# Patient Record
Sex: Female | Born: 1975 | Race: Black or African American | Hispanic: No | Marital: Married | Smoking: Never smoker
Health system: Southern US, Community
[De-identification: ages and names within clinical notes are randomized; demographics above are authoritative.]

## PROBLEM LIST (undated history)

## (undated) ENCOUNTER — Inpatient Hospital Stay (HOSPITAL_COMMUNITY): Payer: Self-pay

## (undated) DIAGNOSIS — R569 Unspecified convulsions: Secondary | ICD-10-CM

## (undated) DIAGNOSIS — B009 Herpesviral infection, unspecified: Secondary | ICD-10-CM

## (undated) DIAGNOSIS — R5600 Simple febrile convulsions: Secondary | ICD-10-CM

## (undated) DIAGNOSIS — D219 Benign neoplasm of connective and other soft tissue, unspecified: Secondary | ICD-10-CM

## (undated) DIAGNOSIS — M5020 Other cervical disc displacement, unspecified cervical region: Secondary | ICD-10-CM

## (undated) DIAGNOSIS — Z8619 Personal history of other infectious and parasitic diseases: Secondary | ICD-10-CM

## (undated) HISTORY — DX: Personal history of other infectious and parasitic diseases: Z86.19

## (undated) HISTORY — DX: Herpesviral infection, unspecified: B00.9

## (undated) HISTORY — DX: Unspecified convulsions: R56.9

---

## 2011-01-08 LAB — OB RESULTS CONSOLE ABO/RH: RH Type: POSITIVE

## 2011-01-08 LAB — OB RESULTS CONSOLE HEPATITIS B SURFACE ANTIGEN: Hepatitis B Surface Ag: NEGATIVE

## 2011-01-08 LAB — OB RESULTS CONSOLE RUBELLA ANTIBODY, IGM: Rubella: IMMUNE

## 2011-01-08 LAB — OB RESULTS CONSOLE GC/CHLAMYDIA
Chlamydia: NEGATIVE
Gonorrhea: NEGATIVE

## 2011-01-08 LAB — OB RESULTS CONSOLE RPR: RPR: NONREACTIVE

## 2011-01-08 LAB — OB RESULTS CONSOLE ANTIBODY SCREEN: Antibody Screen: NEGATIVE

## 2011-01-08 LAB — OB RESULTS CONSOLE HIV ANTIBODY (ROUTINE TESTING): HIV: NONREACTIVE

## 2011-04-06 ENCOUNTER — Encounter (HOSPITAL_BASED_OUTPATIENT_CLINIC_OR_DEPARTMENT_OTHER): Payer: Self-pay | Admitting: Emergency Medicine

## 2011-04-06 ENCOUNTER — Emergency Department (HOSPITAL_BASED_OUTPATIENT_CLINIC_OR_DEPARTMENT_OTHER)
Admission: EM | Admit: 2011-04-06 | Discharge: 2011-04-06 | Disposition: A | Discharge status: Home / Self Care | Payer: BC Managed Care – PPO | Attending: Emergency Medicine | Admitting: Emergency Medicine

## 2011-04-06 DIAGNOSIS — R109 Unspecified abdominal pain: Secondary | ICD-10-CM | POA: Insufficient documentation

## 2011-04-06 DIAGNOSIS — N949 Unspecified condition associated with female genital organs and menstrual cycle: Secondary | ICD-10-CM

## 2011-04-06 LAB — DIFFERENTIAL
Basophils Absolute: 0 10*3/uL (ref 0.0–0.1)
Basophils Relative: 0 % (ref 0–1)
Eosinophils Absolute: 0.1 10*3/uL (ref 0.0–0.7)
Eosinophils Relative: 1 % (ref 0–5)
Lymphocytes Relative: 13 % (ref 12–46)
Lymphs Abs: 1 10*3/uL (ref 0.7–4.0)
Monocytes Absolute: 0.5 10*3/uL (ref 0.1–1.0)
Monocytes Relative: 7 % (ref 3–12)
Neutro Abs: 5.6 10*3/uL (ref 1.7–7.7)
Neutrophils Relative %: 79 % — ABNORMAL HIGH (ref 43–77)

## 2011-04-06 LAB — URINALYSIS, ROUTINE W REFLEX MICROSCOPIC
Bilirubin Urine: NEGATIVE
Glucose, UA: NEGATIVE mg/dL
Hgb urine dipstick: NEGATIVE
Ketones, ur: NEGATIVE mg/dL
Leukocytes, UA: NEGATIVE
Nitrite: NEGATIVE
Protein, ur: NEGATIVE mg/dL
Specific Gravity, Urine: 1.012 (ref 1.005–1.030)
Urobilinogen, UA: 0.2 mg/dL (ref 0.0–1.0)
pH: 8 (ref 5.0–8.0)

## 2011-04-06 LAB — CBC
HCT: 29.8 % — ABNORMAL LOW (ref 36.0–46.0)
Hemoglobin: 10.3 g/dL — ABNORMAL LOW (ref 12.0–15.0)
MCH: 30.8 pg (ref 26.0–34.0)
MCHC: 34.6 g/dL (ref 30.0–36.0)
MCV: 89.2 fL (ref 78.0–100.0)
Platelets: 149 10*3/uL — ABNORMAL LOW (ref 150–400)
RBC: 3.34 MIL/uL — ABNORMAL LOW (ref 3.87–5.11)
RDW: 12.2 % (ref 11.5–15.5)
WBC: 7.1 10*3/uL (ref 4.0–10.5)

## 2011-04-06 NOTE — ED Notes (Signed)
Pt c/o intermittent LLQ pain x 1 wk which worsens after straining to have BM.  Spoke to [facility] by phone last week.

## 2011-04-06 NOTE — ED Provider Notes (Signed)
History     CSN: [id number]  Arrival date & time 04/06/11  0902   First MD Initiated Contact with Patient 04/06/11 1114      Chief Complaint  Patient presents with  . Abdominal Pain    (Consider location/radiation/quality/duration/timing/severity/associated sxs/prior treatment) Patient is a 36 y.o. female presenting with abdominal pain. The history is provided by the patient.  Abdominal Pain The primary symptoms of the illness include abdominal pain.  She is pregnant at [redacted] weeks gestation and is primigravida. For the last week, she has been having intermittent pain in the left suprapubic area. She has been told that she has a small fibroid in that area and was told that it would not affect the pregnancy. Pain is moderate and sometimes worse if she stands for too long. She denies urinary urgency frequency tenesmus or dysuria. Denies vaginal discharge or bleeding. She denies nausea, vomiting or diarrhea. She sometimes feels like she has to strain at a bowel movement but has also been having normal bowel movements. Current pain is rated at 2/10, but it has been as severe as 6/10. There've been no difficulties with this pregnancy. The symptoms have neither been getting better nor worse since onset 1 week ago.  History reviewed. No pertinent past medical history.  History reviewed. No pertinent past surgical history.  No family history on file.  History  Substance Use Topics  . Smoking status: Never Smoker   . Smokeless tobacco: Not on file  . Alcohol Use: No    OB History    Grav Para Term Preterm Abortions TAB SAB Ect Mult Living   1               Review of Systems  Gastrointestinal: Positive for abdominal pain.  All other systems reviewed and are negative.    Allergies  Review of patient's allergies indicates no known allergies.  Home Medications   Current Outpatient Rx  Name Route Sig Dispense Refill  . PRENATAL PO Oral Take 1 tablet by mouth daily.      BP  121/68  Pulse 103  Temp(Src) 98.2 F (36.8 C) (Oral)  Resp 16  SpO2 100%  Physical Exam  Nursing note and vitals reviewed.  36 year old female is resting comfortably and in no acute distress. Vital signs are normal. Head is normocephalic and atraumatic. PERRLA, EOMI. Oropharynx is clear. Neck is nontender and supple. Back is nontender. There's no CVA tenderness. Lungs are clear without rales, wheezes, or rhonchi. Heart has regular rate rhythm without murmur. Abdomen is soft with mild tenderness in the left suprapubic area. Pain is elicited with stress applied to the uterus distracting it to the right consistent with round ligament pain. There is no rebound or guarding. Fundal height is consistent with 18 week pregnancy. There is no other abdominal tenderness. Peristalsis is present. Extremities have no cyanosis or edema. Neurologic: Mental status is normal, cranial nerves are intact, there no focal motor or sensory deficits.  ED Course  Procedures (including critical care time)  Results for orders placed during the hospital encounter of 04/06/11  URINALYSIS, ROUTINE W REFLEX MICROSCOPIC      Component Value Range   Color, Urine YELLOW  YELLOW    APPearance CLEAR  CLEAR    Specific Gravity, Urine 1.012  1.005 - 1.030    pH 8.0  5.0 - 8.0    Glucose, UA NEGATIVE  NEGATIVE (mg/dL)   Hgb urine dipstick NEGATIVE  NEGATIVE    Bilirubin Urine NEGATIVE  NEGATIVE    Ketones, ur NEGATIVE  NEGATIVE (mg/dL)   Protein, ur NEGATIVE  NEGATIVE (mg/dL)   Urobilinogen, UA 0.2  0.0 - 1.0 (mg/dL)   Nitrite NEGATIVE  NEGATIVE    Leukocytes, UA NEGATIVE  NEGATIVE   CBC      Component Value Range   WBC 7.1  4.0 - 10.5 (K/uL)   RBC 3.34 (*) 3.87 - 5.11 (MIL/uL)   Hemoglobin 10.3 (*) 12.0 - 15.0 (g/dL)   HCT 16.1 (*) 09.6 - 46.0 (%)   MCV 89.2  78.0 - 100.0 (fL)   MCH 30.8  26.0 - 34.0 (pg)   MCHC 34.6  30.0 - 36.0 (g/dL)   RDW 04.5  40.9 - 81.1 (%)   Platelets 149 (*) 150 - 400 (K/uL)  DIFFERENTIAL        Component Value Range   Neutrophils Relative 79 (*) 43 - 77 (%)   Neutro Abs 5.6  1.7 - 7.7 (K/uL)   Lymphocytes Relative 13  12 - 46 (%)   Lymphs Abs 1.0  0.7 - 4.0 (K/uL)   Monocytes Relative 7  3 - 12 (%)   Monocytes Absolute 0.5  0.1 - 1.0 (K/uL)   Eosinophils Relative 1  0 - 5 (%)   Eosinophils Absolute 0.1  0.0 - 0.7 (K/uL)   Basophils Relative 0  0 - 1 (%)   Basophils Absolute 0.0  0.0 - 0.1 (K/uL)   No results found.  Workup is negative for any serious pathology. She'll be treated symptomatically and followup with her obstetrician.  1. Round ligament pain       MDM  Probable round ligament pain. Urinalysis and CBC will be obtained to evaluate for other pathology.        Dione Booze, MD 04/06/11 1230

## 2011-07-29 LAB — OB RESULTS CONSOLE GBS: GBS: NEGATIVE

## 2011-09-02 ENCOUNTER — Inpatient Hospital Stay (HOSPITAL_COMMUNITY): Admission: AD | Admit: 2011-09-02 | Payer: Self-pay | Source: Ambulatory Visit | Admitting: Obstetrics and Gynecology

## 2011-09-03 ENCOUNTER — Encounter (HOSPITAL_COMMUNITY): Payer: Self-pay | Admitting: *Deleted

## 2011-09-03 ENCOUNTER — Telehealth (HOSPITAL_COMMUNITY): Payer: Self-pay | Admitting: *Deleted

## 2011-09-03 NOTE — Telephone Encounter (Signed)
Preadmission screen  

## 2011-09-09 ENCOUNTER — Encounter (HOSPITAL_COMMUNITY): Payer: Self-pay

## 2011-09-09 ENCOUNTER — Inpatient Hospital Stay (HOSPITAL_COMMUNITY)
Admission: RE | Admit: 2011-09-09 | Discharge: 2011-09-12 | DRG: 370 | Disposition: A | Discharge status: Home / Self Care | Payer: BC Managed Care – PPO | Source: Ambulatory Visit | Attending: Obstetrics & Gynecology | Admitting: Obstetrics & Gynecology

## 2011-09-09 DIAGNOSIS — O480 Post-term pregnancy: Principal | ICD-10-CM | POA: Diagnosis present

## 2011-09-09 DIAGNOSIS — D696 Thrombocytopenia, unspecified: Secondary | ICD-10-CM | POA: Diagnosis not present

## 2011-09-09 DIAGNOSIS — D4959 Neoplasm of unspecified behavior of other genitourinary organ: Secondary | ICD-10-CM | POA: Diagnosis present

## 2011-09-09 DIAGNOSIS — D689 Coagulation defect, unspecified: Secondary | ICD-10-CM | POA: Diagnosis not present

## 2011-09-09 DIAGNOSIS — D259 Leiomyoma of uterus, unspecified: Secondary | ICD-10-CM | POA: Diagnosis present

## 2011-09-09 DIAGNOSIS — O9902 Anemia complicating childbirth: Secondary | ICD-10-CM | POA: Diagnosis present

## 2011-09-09 DIAGNOSIS — O34599 Maternal care for other abnormalities of gravid uterus, unspecified trimester: Secondary | ICD-10-CM | POA: Diagnosis present

## 2011-09-09 DIAGNOSIS — D638 Anemia in other chronic diseases classified elsewhere: Secondary | ICD-10-CM | POA: Diagnosis present

## 2011-09-09 HISTORY — DX: Benign neoplasm of connective and other soft tissue, unspecified: D21.9

## 2011-09-09 LAB — CBC
HCT: 33.5 % — ABNORMAL LOW (ref 36.0–46.0)
Hemoglobin: 11.4 g/dL — ABNORMAL LOW (ref 12.0–15.0)
MCH: 31.4 pg (ref 26.0–34.0)
MCHC: 34 g/dL (ref 30.0–36.0)
MCV: 92.3 fL (ref 78.0–100.0)
Platelets: 158 10*3/uL (ref 150–400)
RBC: 3.63 MIL/uL — ABNORMAL LOW (ref 3.87–5.11)
RDW: 14.2 % (ref 11.5–15.5)
WBC: 7.9 10*3/uL (ref 4.0–10.5)

## 2011-09-09 MED ORDER — LIDOCAINE HCL (PF) 1 % IJ SOLN: 30.0000 mL | INTRAMUSCULAR | Status: DC | PRN

## 2011-09-09 MED ORDER — TERBUTALINE SULFATE 1 MG/ML IJ SOLN: 0.2500 mg | Freq: Once | INTRAMUSCULAR | Status: AC | PRN

## 2011-09-09 MED ORDER — CITRIC ACID-SODIUM CITRATE 334-500 MG/5ML PO SOLN
30.0000 mL | ORAL | Status: DC | PRN
  Administered 2011-09-10: 30 mL via ORAL
  Filled 2011-09-09: qty 15

## 2011-09-09 MED ORDER — ZOLPIDEM TARTRATE 5 MG PO TABS: 5.0000 mg | ORAL_TABLET | Freq: Every evening | ORAL | Status: DC | PRN

## 2011-09-09 MED ORDER — IBUPROFEN 600 MG PO TABS: 600.0000 mg | ORAL_TABLET | Freq: Four times a day (QID) | ORAL | Status: DC | PRN

## 2011-09-09 MED ORDER — LACTATED RINGERS IV SOLN
INTRAVENOUS | Status: DC
  Administered 2011-09-09 – 2011-09-10 (×5): via INTRAVENOUS

## 2011-09-09 MED ORDER — MISOPROSTOL 25 MCG QUARTER TABLET
25.0000 ug | ORAL_TABLET | ORAL | Status: DC | PRN
  Administered 2011-09-09: 25 ug via VAGINAL
  Filled 2011-09-09: qty 0.25

## 2011-09-09 MED ORDER — ACETAMINOPHEN 325 MG PO TABS: 650.0000 mg | ORAL_TABLET | ORAL | Status: DC | PRN

## 2011-09-09 MED ORDER — BUTORPHANOL TARTRATE 2 MG/ML IJ SOLN: 1.0000 mg | INTRAMUSCULAR | Status: DC | PRN

## 2011-09-09 MED ORDER — OXYCODONE-ACETAMINOPHEN 5-325 MG PO TABS: 1.0000 | ORAL_TABLET | ORAL | Status: DC | PRN

## 2011-09-09 MED ORDER — ONDANSETRON HCL 4 MG/2ML IJ SOLN: 4.0000 mg | Freq: Four times a day (QID) | INTRAMUSCULAR | Status: DC | PRN

## 2011-09-09 MED ORDER — FLEET ENEMA 7-19 GM/118ML RE ENEM: 1.0000 | ENEMA | RECTAL | Status: DC | PRN

## 2011-09-09 MED ORDER — OXYTOCIN BOLUS FROM INFUSION
250.0000 mL | Freq: Once | INTRAVENOUS | Status: DC
  Filled 2011-09-09: qty 500

## 2011-09-09 MED ORDER — LACTATED RINGERS IV SOLN
500.0000 mL | INTRAVENOUS | Status: DC | PRN
  Administered 2011-09-10 (×2): 500 mL via INTRAVENOUS
  Administered 2011-09-10: 1000 mL via INTRAVENOUS

## 2011-09-09 MED ORDER — OXYTOCIN 40 UNITS IN LACTATED RINGERS INFUSION - SIMPLE MED: 62.5000 mL/h | Freq: Once | INTRAVENOUS | Status: DC

## 2011-09-09 NOTE — Progress Notes (Signed)
Dr Christell Constant at bedside without RN explaining to patient plan of caref. He noted prolonged decel in FHR. Dr turned to right side. RN informed to not give second cytotec dose until speaking with Dr Christell Constant to assure good FHR.

## 2011-09-09 NOTE — Progress Notes (Signed)
Melinda [name] is a 36 y.o. G1P0 at [redacted]w[redacted]d by LMP admitted for induction of labor due to Post dates. Due date 09-02-11.  Subjective:  No complaints   Objective: BP 114/75  Pulse 83  Temp 98.3 F (36.8 C) (Oral)  Resp 18  Ht 5\' 6"  (1.676 m)  Wt 92.987 kg (205 lb)  BMI 33.09 kg/m2  LMP 11/26/2010      FHT:  FHR: 140-150s bpm, variability: moderate,  accelerations:  Present,  decelerations:  Present 3 minute prolonged decelfollowed by 2 minutes and has returned to baseline after being placed in RLD UC:   irregular, every 2-3 minutes SVE:   Dilation: 1 Effacement (%): 50 Station: -2 Exam by:: Larose Kells RN  Labs: Lab Results  Component Value Date   WBC 7.9 09/09/2011   HGB 11.4* 09/09/2011   HCT 33.5* 09/09/2011   MCV 92.3 09/09/2011   PLT 158 09/09/2011    Assessment / Plan: Induction of labor due to postterm,  progressing well on pitocin  Labor: for cervical rkipening and concerned about fetal reserve to tolerate labor and is CAT II but now reassuring with change of position Fetal Wellbeing:  Category II Pain Control:  none for now Anticipated MOD:  will depend on FHR  2 Kolyn Rozario H. 09/09/2011, 11:23 PM

## 2011-09-09 NOTE — H&P (Addendum)
History and Physical  CC: Cervical ripening  HPI:  Melinda [name] is an 36 y.o. female. Is here for induction of labor secondary to post dates and with an unfavorable Bishop score will proceed with cervical ripening   ROS: negative and all systems reviewed  Review of Systems - General ROS: negative Psychological ROS: negative Ophthalmic ROS: negative ENT ROS: negative Allergy and Immunology ROS: negative Hematological and Lymphatic ROS: negative Endocrine ROS: negative Respiratory ROS: no cough, shortness of breath, or wheezing Cardiovascular ROS: no chest pain or dyspnea on exertion Gastrointestinal ROS: no abdominal pain, change in bowel habits, or black or bloody stools Genito-Urinary ROS: no dysuria, trouble voiding, or hematuria Musculoskeletal ROS: negative Neurological ROS: negative Dermatological ROS: negative OB History: G1, P0  Pertinent Gynecological History: Contraception: none DES exposure: denies Blood transfusions: none Sexually transmitted diseases: 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   Menses: flow is moderate Bleeding: na  PMH:   Past Medical History  Diagnosis Date  . 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 Herpes   . H/O varicella   . Fibroid     PSH:   Past Surgical History  Procedure Date  . No past surgeries     Medications:   Prescriptions prior to admission  Medication Sig Dispense Refill  . Prenatal Vit-Fe Fumarate-FA (PRENATAL PO) Take 1 tablet by mouth daily.      . valACYclovir (VALTREX) 500 MG tablet Take 500 mg by mouth daily.        Allergies:   Allergies  Allergen Reactions  . Latex Rash    Family Hx:   Family History  Problem Relation Age of Onset  . Goiter Maternal Grandmother   . Hypertension Maternal Grandmother   . Diabetes  Maternal Grandfather   . Cancer Maternal Grandfather     Social History:   reports that she has never smoked. She has never used smokeless tobacco. She reports that she does not drink alcohol or use illicit drugs.  Blood pressure 114/75, pulse 83, temperature 98.3 F (36.8 C), temperature source Oral, resp. rate 18, height 5\' 6"  (1.676 m), weight 92.987 kg (205 lb), last menstrual period 11/26/2010. @PHYSEXAMBYAGE2 @ Gen well appearing gravid HEENT : WNL CHEST cta CV RRR  ABD gravid nontender EXT: 3+ edema DTRs 1-2 + Exam per RN but last in office revealed a gynecoid pelvis and EFW 8#8  Results for orders placed during the hospital encounter of 09/09/11 (from the past 24 hour(s))  CBC     Status: Abnormal   Collection Time   09/09/11  8:10 PM      Component Value Range   WBC 7.9  4.0 - 10.5 K/uL   RBC 3.63 (*) 3.87 - 5.11 MIL/uL   Hemoglobin 11.4 (*) 12.0 - 15.0 g/dL   HCT 40.9 (*) 81.1 - 91.4 %   MCV 92.3  78.0 - 100.0 fL   MCH 31.4  26.0 - 34.0 pg   MCHC 34.0  30.0 - 36.0 g/dL   RDW 78.2  95.6 - 21.3 %   Platelets 158  150 - 400 K/uL    No results found.  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 09/09/2011, 11:05  PM  Addendum    The FHR is revieweed and the FHR has 3 minute  decel that has  RLD now and will monitor at the bedside to ensure return to baseline

## 2011-09-10 ENCOUNTER — Encounter (HOSPITAL_COMMUNITY)
Admission: RE | Disposition: A | Discharge status: Home / Self Care | Payer: Self-pay | Source: Ambulatory Visit | Attending: Obstetrics & Gynecology

## 2011-09-10 ENCOUNTER — Encounter (HOSPITAL_COMMUNITY): Payer: Self-pay

## 2011-09-10 ENCOUNTER — Inpatient Hospital Stay (HOSPITAL_COMMUNITY): Payer: BC Managed Care – PPO | Admitting: Anesthesiology

## 2011-09-10 ENCOUNTER — Encounter (HOSPITAL_COMMUNITY): Payer: Self-pay | Admitting: Anesthesiology

## 2011-09-10 LAB — TYPE AND SCREEN
ABO/RH(D): A POS
Antibody Screen: NEGATIVE

## 2011-09-10 LAB — ABO/RH: ABO/RH(D): A POS

## 2011-09-10 LAB — RPR: RPR Ser Ql: NONREACTIVE

## 2011-09-10 SURGERY — Surgical Case
Anesthesia: Epidural | Site: Abdomen | Wound class: Clean Contaminated

## 2011-09-10 MED ORDER — KETOROLAC TROMETHAMINE 60 MG/2ML IM SOLN
INTRAMUSCULAR | Status: AC
  Filled 2011-09-10: qty 2

## 2011-09-10 MED ORDER — CEFAZOLIN SODIUM 1-5 GM-% IV SOLN
INTRAVENOUS | Status: DC | PRN
  Administered 2011-09-10: 2 g via INTRAVENOUS

## 2011-09-10 MED ORDER — PHENYLEPHRINE 40 MCG/ML (10ML) SYRINGE FOR IV PUSH (FOR BLOOD PRESSURE SUPPORT)
80.0000 ug | PREFILLED_SYRINGE | INTRAVENOUS | Status: DC | PRN
  Filled 2011-09-10: qty 5

## 2011-09-10 MED ORDER — FENTANYL 2.5 MCG/ML BUPIVACAINE 1/10 % EPIDURAL INFUSION (WH - ANES)
INTRAMUSCULAR | Status: DC | PRN
  Administered 2011-09-10: 14 mL/h via EPIDURAL

## 2011-09-10 MED ORDER — ONDANSETRON HCL 4 MG/2ML IJ SOLN
INTRAMUSCULAR | Status: DC | PRN
  Administered 2011-09-10: 4 mg via INTRAVENOUS

## 2011-09-10 MED ORDER — CEFAZOLIN SODIUM-DEXTROSE 2-3 GM-% IV SOLR
INTRAVENOUS | Status: AC
  Filled 2011-09-10: qty 50

## 2011-09-10 MED ORDER — FENTANYL CITRATE 0.05 MG/ML IJ SOLN
INTRAMUSCULAR | Status: DC | PRN
  Administered 2011-09-10 (×2): 50 ug via INTRAVENOUS

## 2011-09-10 MED ORDER — OXYTOCIN 40 UNITS IN LACTATED RINGERS INFUSION - SIMPLE MED
1.0000 m[IU]/min | INTRAVENOUS | Status: DC
  Administered 2011-09-10: 1 m[IU]/min via INTRAVENOUS
  Filled 2011-09-10: qty 1000

## 2011-09-10 MED ORDER — SODIUM BICARBONATE 8.4 % IV SOLN
INTRAVENOUS | Status: DC | PRN
  Administered 2011-09-10: 4 mL via EPIDURAL

## 2011-09-10 MED ORDER — KETOROLAC TROMETHAMINE 60 MG/2ML IM SOLN
60.0000 mg | Freq: Once | INTRAMUSCULAR | Status: AC | PRN
  Administered 2011-09-10: 60 mg via INTRAMUSCULAR

## 2011-09-10 MED ORDER — OXYTOCIN 10 UNIT/ML IJ SOLN
40.0000 [IU] | INTRAVENOUS | Status: DC | PRN
  Administered 2011-09-10: 40 [IU] via INTRAVENOUS

## 2011-09-10 MED ORDER — MORPHINE SULFATE 0.5 MG/ML IJ SOLN
INTRAMUSCULAR | Status: AC
  Filled 2011-09-10: qty 10

## 2011-09-10 MED ORDER — MORPHINE SULFATE (PF) 0.5 MG/ML IJ SOLN
INTRAMUSCULAR | Status: DC | PRN
  Administered 2011-09-10: 4 mg via EPIDURAL

## 2011-09-10 MED ORDER — LACTATED RINGERS IV SOLN
INTRAVENOUS | Status: DC | PRN
  Administered 2011-09-10: 22:00:00 via INTRAVENOUS

## 2011-09-10 MED ORDER — LACTATED RINGERS IV SOLN
INTRAVENOUS | Status: DC
  Administered 2011-09-10: 08:00:00 via INTRAUTERINE

## 2011-09-10 MED ORDER — MEPERIDINE HCL 25 MG/ML IJ SOLN
6.2500 mg | INTRAMUSCULAR | Status: DC | PRN
  Administered 2011-09-10: 6.25 mg via INTRAVENOUS

## 2011-09-10 MED ORDER — EPHEDRINE 5 MG/ML INJ
10.0000 mg | INTRAVENOUS | Status: DC | PRN
  Filled 2011-09-10: qty 4

## 2011-09-10 MED ORDER — MEPERIDINE HCL 25 MG/ML IJ SOLN: 6.2500 mg | INTRAMUSCULAR | Status: DC | PRN

## 2011-09-10 MED ORDER — PHENYLEPHRINE HCL 10 MG/ML IJ SOLN
INTRAMUSCULAR | Status: DC | PRN
  Administered 2011-09-10: 120 ug via INTRAVENOUS
  Administered 2011-09-10 (×2): 80 ug via INTRAVENOUS

## 2011-09-10 MED ORDER — HYDROMORPHONE HCL PF 1 MG/ML IJ SOLN: 0.2500 mg | INTRAMUSCULAR | Status: DC | PRN

## 2011-09-10 MED ORDER — EPHEDRINE 5 MG/ML INJ
10.0000 mg | INTRAVENOUS | Status: DC | PRN
  Administered 2011-09-10: 5 mg via INTRAVENOUS

## 2011-09-10 MED ORDER — LIDOCAINE-EPINEPHRINE (PF) 2 %-1:200000 IJ SOLN
INTRAMUSCULAR | Status: AC
  Filled 2011-09-10: qty 20

## 2011-09-10 MED ORDER — MEPERIDINE HCL 25 MG/ML IJ SOLN
INTRAMUSCULAR | Status: AC
  Filled 2011-09-10: qty 1

## 2011-09-10 MED ORDER — LACTATED RINGERS IV SOLN
INTRAVENOUS | Status: DC
  Administered 2011-09-11: 08:00:00 via INTRAVENOUS
  Administered 2011-09-11: 1000 mL via INTRAVENOUS

## 2011-09-10 MED ORDER — DIPHENHYDRAMINE HCL 50 MG/ML IJ SOLN: 12.5000 mg | INTRAMUSCULAR | Status: DC | PRN

## 2011-09-10 MED ORDER — TERBUTALINE SULFATE 1 MG/ML IJ SOLN
INTRAMUSCULAR | Status: AC
  Filled 2011-09-10: qty 1

## 2011-09-10 MED ORDER — MORPHINE SULFATE (PF) 0.5 MG/ML IJ SOLN
INTRAMUSCULAR | Status: DC | PRN
  Administered 2011-09-10: 1 mg via INTRAVENOUS

## 2011-09-10 MED ORDER — LACTATED RINGERS IV SOLN
500.0000 mL | Freq: Once | INTRAVENOUS | Status: AC
  Administered 2011-09-10: 1000 mL via INTRAVENOUS

## 2011-09-10 MED ORDER — SCOPOLAMINE 1 MG/3DAYS TD PT72
MEDICATED_PATCH | TRANSDERMAL | Status: AC
  Filled 2011-09-10: qty 1

## 2011-09-10 MED ORDER — PHENYLEPHRINE 40 MCG/ML (10ML) SYRINGE FOR IV PUSH (FOR BLOOD PRESSURE SUPPORT): 80.0000 ug | PREFILLED_SYRINGE | INTRAVENOUS | Status: DC | PRN

## 2011-09-10 MED ORDER — FENTANYL CITRATE 0.05 MG/ML IJ SOLN
INTRAMUSCULAR | Status: AC
  Filled 2011-09-10: qty 2

## 2011-09-10 MED ORDER — SODIUM BICARBONATE 8.4 % IV SOLN
INTRAVENOUS | Status: AC
  Filled 2011-09-10: qty 50

## 2011-09-10 MED ORDER — TERBUTALINE SULFATE 1 MG/ML IJ SOLN: 0.2500 mg | Freq: Once | INTRAMUSCULAR | Status: DC | PRN

## 2011-09-10 MED ORDER — OXYTOCIN 10 UNIT/ML IJ SOLN
INTRAMUSCULAR | Status: AC
  Filled 2011-09-10: qty 4

## 2011-09-10 MED ORDER — ONDANSETRON HCL 4 MG/2ML IJ SOLN
INTRAMUSCULAR | Status: AC
  Filled 2011-09-10: qty 2

## 2011-09-10 MED ORDER — PHENYLEPHRINE 40 MCG/ML (10ML) SYRINGE FOR IV PUSH (FOR BLOOD PRESSURE SUPPORT)
PREFILLED_SYRINGE | INTRAVENOUS | Status: AC
  Filled 2011-09-10: qty 5

## 2011-09-10 MED ORDER — SCOPOLAMINE 1 MG/3DAYS TD PT72
1.0000 | MEDICATED_PATCH | Freq: Once | TRANSDERMAL | Status: DC
  Administered 2011-09-10: 1.5 mg via TRANSDERMAL

## 2011-09-10 MED ORDER — PROMETHAZINE HCL 25 MG/ML IJ SOLN: 6.2500 mg | INTRAMUSCULAR | Status: DC | PRN

## 2011-09-10 MED ORDER — KETOROLAC TROMETHAMINE 30 MG/ML IJ SOLN
15.0000 mg | Freq: Once | INTRAMUSCULAR | Status: AC | PRN
  Filled 2011-09-10: qty 1

## 2011-09-10 MED ORDER — FENTANYL 2.5 MCG/ML BUPIVACAINE 1/10 % EPIDURAL INFUSION (WH - ANES)
14.0000 mL/h | INTRAMUSCULAR | Status: DC
  Administered 2011-09-10: 14 mL/h via EPIDURAL
  Filled 2011-09-10 (×2): qty 60

## 2011-09-10 SURGICAL SUPPLY — 31 items
BENZOIN TINCTURE PRP APPL 2/3 (GAUZE/BANDAGES/DRESSINGS) ×2 IMPLANT
CLOTH BEACON ORANGE TIMEOUT ST (SAFETY) ×2 IMPLANT
DRESSING TELFA 8X3 (GAUZE/BANDAGES/DRESSINGS) ×2 IMPLANT
DRSG COVADERM 4X10 (GAUZE/BANDAGES/DRESSINGS) ×2 IMPLANT
ELECT REM PT RETURN 9FT ADLT (ELECTROSURGICAL) ×2
ELECTRODE REM PT RTRN 9FT ADLT (ELECTROSURGICAL) ×1 IMPLANT
EXTRACTOR VACUUM M CUP 4 TUBE (SUCTIONS) IMPLANT
GLOVE BIO SURGEON STRL SZ 6.5 (GLOVE) IMPLANT
GLOVE BIO SURGEON STRL SZ7.5 (GLOVE) ×2 IMPLANT
GLOVE BIOGEL PI IND STRL 7.0 (GLOVE) IMPLANT
GLOVE BIOGEL PI IND STRL 8 (GLOVE) ×1 IMPLANT
GLOVE BIOGEL PI INDICATOR 7.0 (GLOVE)
GLOVE BIOGEL PI INDICATOR 8 (GLOVE) ×1
GOWN PREVENTION PLUS LG XLONG (DISPOSABLE) ×2 IMPLANT
GOWN PREVENTION PLUS XLARGE (GOWN DISPOSABLE) IMPLANT
GOWN STRL REIN XL XLG (GOWN DISPOSABLE) ×2 IMPLANT
KIT ABG SYR 3ML LUER SLIP (SYRINGE) ×4 IMPLANT
NEEDLE HYPO 25X5/8 SAFETYGLIDE (NEEDLE) ×4 IMPLANT
NS IRRIG 1000ML POUR BTL (IV SOLUTION) ×2 IMPLANT
PACK C SECTION WH (CUSTOM PROCEDURE TRAY) ×2 IMPLANT
PAD ABD 7.5X8 STRL (GAUZE/BANDAGES/DRESSINGS) ×2 IMPLANT
SLEEVE SCD COMPRESS KNEE MED (MISCELLANEOUS) ×2 IMPLANT
STRIP CLOSURE SKIN 1/2X4 (GAUZE/BANDAGES/DRESSINGS) ×2 IMPLANT
SUT GUT PLAIN 0 CT-3 TAN 27 (SUTURE) IMPLANT
SUT MON AB 4-0 PS1 27 (SUTURE) ×2 IMPLANT
SUT PLAIN 2 0 XLH (SUTURE) ×2 IMPLANT
SUT VIC AB 0 CT1 36 (SUTURE) ×10 IMPLANT
TAPE CLOTH SURG 4X10 WHT LF (GAUZE/BANDAGES/DRESSINGS) ×2 IMPLANT
TOWEL OR 17X24 6PK STRL BLUE (TOWEL DISPOSABLE) ×4 IMPLANT
TRAY FOLEY CATH 14FR (SET/KITS/TRAYS/PACK) IMPLANT
WATER STERILE IRR 1000ML POUR (IV SOLUTION) IMPLANT

## 2011-09-10 NOTE — Progress Notes (Signed)
Melinda [name] is a 36 y.o. G1P0 at [redacted]w[redacted]d by LMP admitted for induction of labor due to Post dates. Due date 09/09/11  Subjective: Starting to feel the contractions but not too bad Objective: BP 118/79  Pulse 93  Temp 98.4 F (36.9 C) (Oral)  Resp 20  Ht 5\' 6"  (1.676 m)  Wt 92.987 kg (205 lb)  BMI 33.09 kg/m2  LMP 11/26/2010      FHT:  FHR: 140s bpm, variability: moderate,  accelerations:  Present,  decelerations:  Present intermittent and sporadic decels and unable to fully qualify other than prolonged at times and unclear etiology especially in light of the very reassuring tracing at other times UC:   irregular, every 3-5 minutes SVE:   Dilation: 4 Effacement (%): 70 Station: -2 Exam by:: Enis Slipper, RN  Labs: Lab Results  Component Value Date   WBC 7.9 09/09/2011   HGB 11.4* 09/09/2011   HCT 33.5* 09/09/2011   MCV 92.3 09/09/2011   PLT 158 09/09/2011    Assessment / Plan: Induction of labor due to postterm,  progressing well on pitocin  Labor: Progressing on Pitocin, will continue to increase then AROM Preeclampsia:  na Fetal Wellbeing:  Category II Pain Control:  Epidural is planned I/D:  n/a Anticipated MOD:  NSVD  Mohanad Carsten H. 09/10/2011, 12:08 PM

## 2011-09-10 NOTE — Anesthesia Postprocedure Evaluation (Signed)
Anesthesia Post Note  Patient: Melinda [name]  Procedure(s) Performed: Procedure(s) (LRB): CESAREAN SECTION (N/A)  Anesthesia type: Epidural  Patient location: PACU  Post pain: Pain level controlled  Post assessment: Post-op Vital signs reviewed  Last Vitals:  Filed Vitals:   09/10/11 2307  BP:   Pulse: 103  Temp: 37.1 C  Resp:     Post vital signs: Reviewed  Level of consciousness: awake  Complications: No apparent anesthesia complications

## 2011-09-10 NOTE — Progress Notes (Signed)
Melinda [name] is a 36 y.o. G1P0000 at [redacted]w[redacted]d by LMP admitted for induction of labor due to Post dates. Due date 09-02-11.  Subjective:  Still comfotable Objective: BP 117/70  Pulse 97  Temp 99.1 F (37.3 C) (Axillary)  Resp 18  Ht 5\' 6"  (1.676 m)  Wt 92.987 kg (205 lb)  BMI 33.09 kg/m2  SpO2 100%  LMP 11/26/2010 I/O last 3 completed shifts: In: -  Out: 300 [Urine:300] Total I/O In: -  Out: 100 [Urine:100]  FHT:  FHR: 150s bpm, variability: moderate,  accelerations:  Present,  decelerations:  Present variables with some late appearing decels that are nonrepetitive as well and now with good reactivity UC:   Have decreased in intensity and frequency and now off of pitocin SVE:   Dilation: 5.5 Effacement (%): 90 Station: -2 Exam by:: Melinda Slipper, RN  Labs: Lab Results  Component Value Date   WBC 7.9 09/09/2011   HGB 11.4* 09/09/2011   HCT 33.5* 09/09/2011   MCV 92.3 09/09/2011   PLT 158 09/09/2011   2 Assessment / Plan: IUP 41w1 with category II tracing but resolves with pitocin off and seems to have more reassuring signs now  Labor:      Preeclampsia:    Fetal Wellbeing:  Category II Pain Control:  Epidural I/D:  n/a Anticipated MOD:     Melinda Forlenza H. 09/10/2011, 8:19 PM

## 2011-09-10 NOTE — Progress Notes (Signed)
Melinda [name] is a 36 y.o. G1P0000 at [redacted]w[redacted]d by LMP admitted for induction of labor due to Post dates. Due date 09-02-11.  Subjective:  Pt comfortable with CLE and progressing slowly   But there are intermittent episodic decelerations  Objective: BP 119/64  Pulse 227  Temp 97.9 F (36.6 C) (Axillary)  Resp 20  Ht 5\' 6"  (1.676 m)  Wt 92.987 kg (205 lb)  BMI 33.09 kg/m2  SpO2 100%  LMP 11/26/2010   Total I/O In: -  Out: 300 [Urine:300]  FHT:  FHR: 140s bpm, variability: moderate,  accelerations:  Present,  decelerations:  Present with decelerations and prolonged and may be related most recently to tachysystole and the FHR seems to return to baseline after severeal changes of position and may consider terb and will stay at bedside until resolved pit off and RLD and O2 on and did receive fluid bolus UC:a series of ctrx every minute and this has started to resolve with the pitocin off SVE:   Dilation: 4.5 Effacement (%): 90 Station: -2 Exam by:: Melinda [name], RN5-6/90/0 vertex with some minimal molding Labs: Lab Results  Component Value Date   WBC 7.9 09/09/2011   HGB 11.4* 09/09/2011   HCT 33.5* 09/09/2011   MCV 92.3 09/09/2011   PLT 158 09/09/2011    Assessment / Plan: Induction of labor due to postterm,  progressing well on pitocin I am concerned that the baby is developing intolerance of labor Labor: may have intolerance of labor and will see if she will tolerate with a rest right now and then possibly another trial in one hour Preeclampsia:  na Fetal Wellbeing:  Category II Pain Control:  Epidural I/D:  n/a Anticipated MOD:  unclear and amnioinfusion for meconium brown not particulate   Melinda [name] H. 09/10/2011, 6:13 PM

## 2011-09-10 NOTE — Progress Notes (Signed)
Melinda [name] is a 36 y.o. G1P0 at [redacted]w[redacted]d by LMP admitted for induction of labor due to Post dates. Due date 09-02-11.  Subjective:  States she slept well Objective: BP 105/52  Pulse 104  Temp 98 F (36.7 C) (Oral)  Resp 18  Ht 5\' 6"  (1.676 m)  Wt 92.987 kg (205 lb)  BMI 33.09 kg/m2  LMP 11/26/2010      FHT:  FHR: 130-140s bpm, variability: moderate,  accelerations:  Present,  decelerations:  Absent UC:   regular, every 3-4 minutes SVE:   Dilation: 1 Effacement (%): 50 Station: -2 Exam by:: Larose Kells RN 3-4/80/-3/vertex / soft /anterior and AROM with meconium and IUPC and amnioinfusion placed Labs: Lab Results  Component Value Date   WBC 7.9 09/09/2011   HGB 11.4* 09/09/2011   HCT 33.5* 09/09/2011   MCV 92.3 09/09/2011   PLT 158 09/09/2011    Assessment / Plan: Induction of labor due to postterm,  progressing well on pitocin  Labor: will start pitocin Preeclampsia:  na Fetal Wellbeing:  Category I Pain Control:  Epidural I/D:  n/a Anticipated MOD:  NSVD as baby has tolerated well the contractions overnight  Breken Nazari H. 09/10/2011, 7:15 AM

## 2011-09-10 NOTE — Transfer of Care (Signed)
Immediate Anesthesia Transfer of Care Note  Patient: Melinda [name]  Procedure(s) Performed: Procedure(s) (LRB): CESAREAN SECTION (N/A)  Patient Location: PACU  Anesthesia Type: Epidural  Level of Consciousness: awake, alert  and oriented  Airway & Oxygen Therapy: Patient Spontanous Breathing  Post-op Assessment: Report given to PACU RN and Post -op Vital signs reviewed and stable  Post vital signs: Reviewed and stable  Complications: No apparent anesthesia complications

## 2011-09-10 NOTE — Anesthesia Procedure Notes (Signed)
Epidural Patient location during procedure: OB  Preanesthetic Checklist Completed: patient identified, site marked, surgical consent, pre-op evaluation, timeout performed, IV checked, risks and benefits discussed and monitors and equipment checked  Epidural Patient position: sitting Prep: site prepped and draped and DuraPrep Patient monitoring: continuous pulse ox and blood pressure Approach: midline Injection technique: LOR air  Needle:  Needle type: Tuohy  Needle gauge: 17 G Needle length: 9 cm Needle insertion depth: 5 cm cm Catheter type: closed end flexible Catheter size: 19 Gauge Catheter at skin depth: 10 cm Test dose: negative  Assessment Events: blood not aspirated, injection not painful, no injection resistance, negative IV test and no paresthesia  Additional Notes Dosing of Epidural:  1st dose, through needle ............................................Marland Kitchen epi 1:200K + Xylocaine 40 mg  2nd dose, through catheter, after waiting 3 minutes...Marland KitchenMarland Kitchenepi 1:200K + Xylocaine 60 mg  3rd dose, through catheter after waiting 3 minutes .............................Marcaine   5mg    ( mg Marcaine are expressed as equivilent  cc's medication removed from the 0.1%Bupiv / fentanyl syringe from L&D pump)  ( 2% Xylo charted as a single dose in Epic Meds for ease of charting; actual dosing was fractionated as above, for saftey's sake)  As each dose occurred, patient was free of IV sx; and patient exhibited no evidence of SA injection.  Patient is more comfortable after epidural dosed. Please see RN's note for documentation of vital signs,and FHR which are stable.  Patient reminded not to try to ambulate with numb legs, and that an RN must be present when she attempts to get up.

## 2011-09-10 NOTE — Anesthesia Preprocedure Evaluation (Addendum)

## 2011-09-10 NOTE — Op Note (Signed)
Cesarean Section Procedure Note   09/10/2011   Melinda [name]      Pre-operative Diagnosis:   1. Fetal Intolerence to Labor.( Intermittent prolonged decelerations)    2.  nonreassuring fetal status (developed in the OR)    3.  IUP [redacted]w[redacted]d    4.  Fibroids    5.  Thick meconium      Post-operative Diagnosis: Same plus delivered   Surgeon: Delbert Harness.  Assistants: CST  Anesthesia: epidural    Findings:      1. Viable female infant with Apgars  APGAR (1 MIN):  9 APGAR (5 MINS):  9 APGAR (10 MINS):                   with a weight of @BABYWGTLBSEBC @ @BABYWGTOZEBC @ weight pending    2. umbilical cord artery pH 7.27    3. the placenta was calcified is a term placenta.    4. A foot cord was noted    5. Several fibroids were noted and the largest was left posterior fundus at 5 cm   Estimated Blood Loss:   Total IV Fluids:   Urine Output: 75CC OF bloody urine  Specimens: 1. Placenta to pathology  Complications: no complications  Disposition:  PACU - hemodynamically stable.  Maternal Condition: stable  Baby condition / location:  nursery-stable  Indications:fetal intolerance of labor and nonreassuring fetal status      The patient presented on 09/09/2011  to L&D and subsequently was induced with cytotec and Pitocin and progressed to 5 -6cm  0% effaced and 0 station but the FHR became nonreassuring .  amniotomy was performed this am and meconium brown thick nonparticulate fluid noted. .The patient then developed some reassuring category II tracing. Patient had previously been consented was once again told of the procedure. The anesthesiologist was notified and consent signed and the patient taken to the operating room.     The risks, benefits, complications, treatment options, and expected outcomes were discussed with the patient. The patient concurred with the proposed plan, giving informed consent  Procedure Details:  The patient was moved to the operating  room.  The patient was identified as  Melinda [name] and the procedure verified as C-Section Delivery. A Time Out was held and the above information confirmed. Ancef 2 g ongoing at that time the FHR was noted to declerate to 40 BPMs and the decision was made to move quickly toward delivery   The patient was draped and prepped in the usual sterile manner.  Anesthesia was noted to be adequate.   Pfannenstiel incsion was made and the subcutaneous tissue was incised to the fascia. The fascial was nicked over both rectus muscle bellies and then was extended transversely. Kocher clamps and the the fascia was separated from the underlying rectus muscle superiorly and inferiorly. The peritoneum was entered bluntly. The utero-vesical peritoneal reflection was elevated and incised transversely and the bladder flap was bluntly freed from the lower uterine segment. A low transverse uterine incision was made and maintained within the lower uterine segment. Delivered from cephalic presentation was a not available gram living newborn female infant with Apgar scores of 9 at one minute and 9 at five minutes. A cord ph was sent. The umbilical cord was clamped and cut cord. A sample was obtained for evaluation. The placenta was removed Intact and appeared normal except for a foot cord..    The uterine incision was closed with running locked sutures of  0 Vicryl. A second layer imbricated the first and one figure of eight was thrown on the midline of the hysterotomy to complete hemostasis. Hemostasis was observed.  The upper abdomen was irrigated.  Hysterotomy reinspected and noted to be hemostatic The uterus was returned to the abdomen and the pericolic sponges were removed.   Interceed was placed in a T fashion.  The fascia was then reapproximated with running 0 Vicryl and tied at the right 1/3 margin.  The skin was closed with 3-0 monocryl.. The wound was appropriately dressed patient placed on bed and taken to the recovery room  in awake and stable condition and the baby was in the father's arms heading to the birthing room.   Instrument sponge lap and needle  counts were correct prior the abdominal closure and were correct at the conclusion of the case.     Signed: Surgeon(s): Waverly Ferrari. Christell Constant, MD

## 2011-09-10 NOTE — Progress Notes (Signed)
Patient ID: Melinda [name], female   DOB: May 05, 1975, 37 y.o.   MRN: [id number] The FHR does not seem to be able to proceed with a time interval that would safely allow [place] to restart the pitocin and we have tried all of the conservative measures except for administering terbuataline.  We will proceed with CSx when the resources are available

## 2011-09-11 ENCOUNTER — Encounter (HOSPITAL_COMMUNITY): Payer: Self-pay

## 2011-09-11 LAB — COMPREHENSIVE METABOLIC PANEL
ALT: 13 U/L (ref 0–35)
AST: 25 U/L (ref 0–37)
Albumin: 2 g/dL — ABNORMAL LOW (ref 3.5–5.2)
Alkaline Phosphatase: 105 U/L (ref 39–117)
BUN: 10 mg/dL (ref 6–23)
CO2: 26 mEq/L (ref 19–32)
Calcium: 8.3 mg/dL — ABNORMAL LOW (ref 8.4–10.5)
Chloride: 100 mEq/L (ref 96–112)
Creatinine, Ser: 1.15 mg/dL — ABNORMAL HIGH (ref 0.50–1.10)
GFR calc Af Amer: 72 mL/min — ABNORMAL LOW (ref >90)
GFR calc non Af Amer: 62 mL/min — ABNORMAL LOW (ref >90)
Glucose, Bld: 80 mg/dL (ref 70–99)
Potassium: 4.5 mEq/L (ref 3.5–5.1)
Sodium: 132 mEq/L — ABNORMAL LOW (ref 135–145)
Total Bilirubin: 0.3 mg/dL (ref 0.3–1.2)
Total Protein: 5.1 g/dL — ABNORMAL LOW (ref 6.0–8.3)

## 2011-09-11 LAB — CBC
HCT: 31 % — ABNORMAL LOW (ref 36.0–46.0)
HCT: 28.9 % — ABNORMAL LOW (ref 36.0–46.0)
Hemoglobin: 10.3 g/dL — ABNORMAL LOW (ref 12.0–15.0)
Hemoglobin: 9.8 g/dL — ABNORMAL LOW (ref 12.0–15.0)
MCH: 31.1 pg (ref 26.0–34.0)
MCH: 31.6 pg (ref 26.0–34.0)
MCHC: 33.2 g/dL (ref 30.0–36.0)
MCHC: 33.9 g/dL (ref 30.0–36.0)
MCV: 93.7 fL (ref 78.0–100.0)
MCV: 93.2 fL (ref 78.0–100.0)
Platelets: 129 10*3/uL — ABNORMAL LOW (ref 150–400)
Platelets: 120 10*3/uL — ABNORMAL LOW (ref 150–400)
RBC: 3.31 MIL/uL — ABNORMAL LOW (ref 3.87–5.11)
RBC: 3.1 MIL/uL — ABNORMAL LOW (ref 3.87–5.11)
RDW: 14 % (ref 11.5–15.5)
RDW: 14.2 % (ref 11.5–15.5)
WBC: 17.8 10*3/uL — ABNORMAL HIGH (ref 4.0–10.5)
WBC: 15.9 10*3/uL — ABNORMAL HIGH (ref 4.0–10.5)

## 2011-09-11 LAB — CCBB MATERNAL DONOR DRAW

## 2011-09-11 MED ORDER — DIPHENHYDRAMINE HCL 50 MG/ML IJ SOLN: 25.0000 mg | INTRAMUSCULAR | Status: DC | PRN

## 2011-09-11 MED ORDER — SODIUM CHLORIDE 0.9 % IV SOLN
1.0000 ug/kg/h | INTRAVENOUS | Status: DC | PRN
  Filled 2011-09-11: qty 2.5

## 2011-09-11 MED ORDER — SIMETHICONE 80 MG PO CHEW
80.0000 mg | CHEWABLE_TABLET | Freq: Three times a day (TID) | ORAL | Status: DC
  Administered 2011-09-11 – 2011-09-12 (×5): 80 mg via ORAL

## 2011-09-11 MED ORDER — SENNOSIDES-DOCUSATE SODIUM 8.6-50 MG PO TABS
2.0000 | ORAL_TABLET | Freq: Every day | ORAL | Status: DC
  Administered 2011-09-11: 2 via ORAL

## 2011-09-11 MED ORDER — METOCLOPRAMIDE HCL 5 MG/ML IJ SOLN
10.0000 mg | Freq: Three times a day (TID) | INTRAMUSCULAR | Status: DC | PRN
  Administered 2011-09-11: 10 mg via INTRAVENOUS
  Filled 2011-09-11: qty 2

## 2011-09-11 MED ORDER — TETANUS-DIPHTH-ACELL PERTUSSIS 5-2.5-18.5 LF-MCG/0.5 IM SUSP: 0.5000 mL | Freq: Once | INTRAMUSCULAR | Status: DC

## 2011-09-11 MED ORDER — KETOROLAC TROMETHAMINE 30 MG/ML IJ SOLN
30.0000 mg | Freq: Four times a day (QID) | INTRAMUSCULAR | Status: AC | PRN
  Administered 2011-09-11: 30 mg via INTRAVENOUS

## 2011-09-11 MED ORDER — IBUPROFEN 600 MG PO TABS
600.0000 mg | ORAL_TABLET | Freq: Four times a day (QID) | ORAL | Status: DC
  Administered 2011-09-11 – 2011-09-12 (×4): 600 mg via ORAL
  Filled 2011-09-11 (×4): qty 1

## 2011-09-11 MED ORDER — WITCH HAZEL-GLYCERIN EX PADS: 1.0000 "application " | MEDICATED_PAD | CUTANEOUS | Status: DC | PRN

## 2011-09-11 MED ORDER — ONDANSETRON HCL 4 MG/2ML IJ SOLN
4.0000 mg | INTRAMUSCULAR | Status: DC | PRN
  Administered 2011-09-11: 4 mg via INTRAVENOUS
  Filled 2011-09-11: qty 2

## 2011-09-11 MED ORDER — PRENATAL MULTIVITAMIN CH
1.0000 | ORAL_TABLET | Freq: Every day | ORAL | Status: DC
  Administered 2011-09-12: 1 via ORAL
  Filled 2011-09-11: qty 1

## 2011-09-11 MED ORDER — KETOROLAC TROMETHAMINE 30 MG/ML IJ SOLN: 30.0000 mg | Freq: Four times a day (QID) | INTRAMUSCULAR | Status: AC | PRN

## 2011-09-11 MED ORDER — DIPHENHYDRAMINE HCL 25 MG PO CAPS: 25.0000 mg | ORAL_CAPSULE | Freq: Four times a day (QID) | ORAL | Status: DC | PRN

## 2011-09-11 MED ORDER — ONDANSETRON HCL 4 MG/2ML IJ SOLN: 4.0000 mg | Freq: Three times a day (TID) | INTRAMUSCULAR | Status: DC | PRN

## 2011-09-11 MED ORDER — OXYTOCIN 40 UNITS IN LACTATED RINGERS INFUSION - SIMPLE MED: 62.5000 mL/h | INTRAVENOUS | Status: AC

## 2011-09-11 MED ORDER — NALBUPHINE HCL 10 MG/ML IJ SOLN
5.0000 mg | INTRAMUSCULAR | Status: DC | PRN
  Filled 2011-09-11: qty 1

## 2011-09-11 MED ORDER — ONDANSETRON HCL 4 MG PO TABS: 4.0000 mg | ORAL_TABLET | ORAL | Status: DC | PRN

## 2011-09-11 MED ORDER — DIPHENHYDRAMINE HCL 25 MG PO CAPS: 25.0000 mg | ORAL_CAPSULE | ORAL | Status: DC | PRN

## 2011-09-11 MED ORDER — CEFAZOLIN SODIUM-DEXTROSE 2-3 GM-% IV SOLR
2.0000 g | INTRAVENOUS | Status: DC
  Filled 2011-09-11: qty 50

## 2011-09-11 MED ORDER — OXYCODONE-ACETAMINOPHEN 5-325 MG PO TABS: 1.0000 | ORAL_TABLET | ORAL | Status: DC | PRN

## 2011-09-11 MED ORDER — SODIUM CHLORIDE 0.9 % IJ SOLN: 3.0000 mL | INTRAMUSCULAR | Status: DC | PRN

## 2011-09-11 MED ORDER — ZOLPIDEM TARTRATE 5 MG PO TABS: 5.0000 mg | ORAL_TABLET | Freq: Every evening | ORAL | Status: DC | PRN

## 2011-09-11 MED ORDER — NALOXONE HCL 0.4 MG/ML IJ SOLN: 0.4000 mg | INTRAMUSCULAR | Status: DC | PRN

## 2011-09-11 MED ORDER — TETANUS-DIPHTH-ACELL PERTUSSIS 5-2.5-18.5 LF-MCG/0.5 IM SUSP
0.5000 mL | Freq: Once | INTRAMUSCULAR | Status: AC
  Administered 2011-09-12: 0.5 mL via INTRAMUSCULAR
  Filled 2011-09-11: qty 0.5

## 2011-09-11 MED ORDER — MENTHOL 3 MG MT LOZG: 1.0000 | LOZENGE | OROMUCOSAL | Status: DC | PRN

## 2011-09-11 MED ORDER — DEXTROSE 5 % IV BOLUS
1000.0000 mL | Freq: Once | INTRAVENOUS | Status: AC
  Administered 2011-09-11: 1000 mL via INTRAVENOUS

## 2011-09-11 MED ORDER — LANOLIN HYDROUS EX OINT: 1.0000 "application " | TOPICAL_OINTMENT | CUTANEOUS | Status: DC | PRN

## 2011-09-11 MED ORDER — DIBUCAINE 1 % RE OINT: 1.0000 "application " | TOPICAL_OINTMENT | RECTAL | Status: DC | PRN

## 2011-09-11 MED ORDER — DIPHENHYDRAMINE HCL 50 MG/ML IJ SOLN: 12.5000 mg | INTRAMUSCULAR | Status: DC | PRN

## 2011-09-11 MED ORDER — SIMETHICONE 80 MG PO CHEW: 80.0000 mg | CHEWABLE_TABLET | ORAL | Status: DC | PRN

## 2011-09-11 NOTE — Progress Notes (Signed)
Subjective: Postpartum Day 1: Cesarean Delivery Patient reports no problems voiding. And has had good pain control and states she has not passed flatus Objective: Vital signs in last 24 hours: Temp:  [97.6 F (36.4 C)-99.3 F (37.4 C)] 98.6 F (37 C) (07/18 1512) Pulse Rate:  [76-128] 87  (07/18 1512) Resp:  [18-26] 18  (07/18 1512) BP: (84-130)/(52-80) 108/61 mmHg (07/18 1512) SpO2:  [94 %-100 %] 96 % (07/18 1512)  Physical Exam:  General: alert, cooperative and no distress Lochia: appropriate Uterine Fundus: firm and non tender and positive tympany and non acute  Incision: healing well, no significant drainage, the dressing was stained but no acitve drainage or bleeding or erythema DVT Evaluation: No evidence of DVT seen on physical exam. Negative Homan's sign. No cords or calf tenderness. 3+ pitting edema  Basename 09/11/11 1340 09/11/11 0540  HGB 9.8* 10.3*  HCT 28.9* 31.0*    Assessment/Plan: Status post Cesarean section. Doing well postoperatively. and earlier concerns of oliguria have resolved   IUP [redacted]w[redacted]d delivered  S/P CSX secondary to fetal intolerance of labor and then nonreassuring fetal status in the OR  Fibroids  H/o HSV no lesions  Thick meconium  Thrombocytopenia noted on post op CBC  Breast feeding Anemia chronic  Continue current care. Recheck CBC in am Nekesha Font H. 09/11/2011, 6:30 PM

## 2011-09-11 NOTE — Anesthesia Postprocedure Evaluation (Signed)
  Anesthesia Post-op Note  Patient: Melinda [name]  Procedure(s) Performed: Procedure(s) (LRB): CESAREAN SECTION (N/A)  Patient Location: Mother/Baby  Anesthesia Type: Epidural  Level of Consciousness: awake and alert   Airway and Oxygen Therapy: Patient Spontanous Breathing  Post-op Pain: none  Post-op Assessment: Patient's Cardiovascular Status Stable, Respiratory Function Stable, Patent Airway, No signs of Nausea or vomiting, Adequate PO intake, Pain level controlled, No headache, No backache, No residual numbness and No residual motor weakness  Post-op Vital Signs: Reviewed and stable  Complications: No apparent anesthesia complications

## 2011-09-11 NOTE — Progress Notes (Signed)
Chart reviewed  CBC    Component Value Date/Time   WBC 17.8* 09/11/2011 0540   RBC 3.31* 09/11/2011 0540   HGB 10.3* 09/11/2011 0540   HCT 31.0* 09/11/2011 0540   PLT 129* 09/11/2011 0540   MCV 93.7 09/11/2011 0540   MCH 31.1 09/11/2011 0540   MCHC 33.2 09/11/2011 0540   RDW 14.0 09/11/2011 0540   LYMPHSABS 1.0 04/06/2011 1130   MONOABS 0.5 04/06/2011 1130   EOSABS 0.1 04/06/2011 1130   BASOSABS 0.0 04/06/2011 1130   Thrombocytopenia noted and will repeat lab  Temp:  [97.6 F (36.4 C)-99.3 F (37.4 C)] 98.9 F (37.2 C) (07/18 1235) Pulse Rate:  [76-227] 93  (07/18 1235) Resp:  [18-26] 18  (07/18 1235) BP: (84-146)/(48-86) 95/55 mmHg (07/18 1235) SpO2:  [87 %-100 %] 96 % (07/18 1235)\  A: IUP [redacted]w[redacted]d delivered S/P CSX secondary to fetal intolerance of labor and then nonreassuring fetal status in the OR Fibroids 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

## 2011-09-11 NOTE — Addendum Note (Signed)
Addendum  created 09/11/11 4540 by Suella Grove, CRNA   Modules edited:Notes Section

## 2011-09-12 LAB — CBC WITH DIFFERENTIAL/PLATELET
Basophils Absolute: 0 10*3/uL (ref 0.0–0.1)
Basophils Relative: 0 % (ref 0–1)
Eosinophils Absolute: 0.1 10*3/uL (ref 0.0–0.7)
Eosinophils Relative: 1 % (ref 0–5)
HCT: 26.5 % — ABNORMAL LOW (ref 36.0–46.0)
Hemoglobin: 9.1 g/dL — ABNORMAL LOW (ref 12.0–15.0)
Lymphocytes Relative: 12 % (ref 12–46)
Lymphs Abs: 1.6 10*3/uL (ref 0.7–4.0)
MCH: 31.9 pg (ref 26.0–34.0)
MCHC: 34.3 g/dL (ref 30.0–36.0)
MCV: 93 fL (ref 78.0–100.0)
Monocytes Absolute: 1.4 10*3/uL — ABNORMAL HIGH (ref 0.1–1.0)
Monocytes Relative: 11 % (ref 3–12)
Neutro Abs: 10 10*3/uL — ABNORMAL HIGH (ref 1.7–7.7)
Neutrophils Relative %: 76 % (ref 43–77)
Platelets: 122 10*3/uL — ABNORMAL LOW (ref 150–400)
RBC: 2.85 MIL/uL — ABNORMAL LOW (ref 3.87–5.11)
RDW: 14.1 % (ref 11.5–15.5)
WBC: 13.1 10*3/uL — ABNORMAL HIGH (ref 4.0–10.5)

## 2011-09-12 MED ORDER — OXYCODONE-ACETAMINOPHEN 5-325 MG PO TABS: 1.0000 | ORAL_TABLET | ORAL | Status: AC | PRN

## 2011-09-12 MED ORDER — ACETAMINOPHEN 500 MG PO TABS: 500.0000 mg | ORAL_TABLET | Freq: Four times a day (QID) | ORAL | Status: AC | PRN

## 2011-09-12 MED ORDER — IBUPROFEN 200 MG PO TABS: 200.0000 mg | ORAL_TABLET | Freq: Four times a day (QID) | ORAL | Status: AC | PRN

## 2011-09-12 NOTE — Progress Notes (Signed)
Patient ID: Melinda [name], female   DOB: [birth year], 36 y.o.   MRN: [id number] See discharge summary

## 2011-09-12 NOTE — Progress Notes (Signed)
Post discharge chart review completed.  

## 2011-09-12 NOTE — Discharge Summary (Signed)
Obstetric Discharge Summary Reason for Admission: induction of labor and Secondary to post dates at 41 weeks and 1 day Prenatal Procedures: Cervical ripening with Cytotec 25 mcg x2 Intrapartum Procedures: cesarean: low cervical, transverse and Secondary to initially fetal intolerance of labor and then the operating room the patient's fetal heart rate developed a deceleration into the 40 beats per minute and the patient was prepped more urgently for nonreassuring fetal status Postpartum Procedures: none there was some concern with regard to thrombocytopenia-stable and initial oliguria, but this resolved. Complications-Operative and Postpartum: none Hemoglobin  Date Value Range Status  09/12/2011 9.1* 12.0 - 15.0 g/dL Final     HCT  Date Value Range Status  09/12/2011 26.5* 36.0 - 46.0 % Final   CBC    Component Value Date/Time   WBC 13.1* 09/12/2011 0540   RBC 2.85* 09/12/2011 0540   HGB 9.1* 09/12/2011 0540   HCT 26.5* 09/12/2011 0540   PLT 122* 09/12/2011 0540   MCV 93.0 09/12/2011 0540   MCH 31.9 09/12/2011 0540   MCHC 34.3 09/12/2011 0540   RDW 14.1 09/12/2011 0540   LYMPHSABS 1.6 09/12/2011 0540   MONOABS 1.4* 09/12/2011 0540   EOSABS 0.1 09/12/2011 0540   BASOSABS 0.0 09/12/2011 0540    Physical Exam:  General: alert, cooperative, appears stated age and no distress Lochia: appropriate Uterine Fundus: firm Incision: healing well DVT Evaluation: No evidence of DVT seen on physical exam. Negative Homan's sign. No cords or calf tenderness. 3+ pretibial edema is noted Discharge Diagnoses: Term Pregnancy-delivered, Post-date pregnancy and Thick meconium that was not particulate was noted amnioinfusion was given Status post C-section Fibroid uterus Thrombocytopenia-stable Anemia-chronic Postop oliguria resolved Discharge Information: Date: 09/12/2011 Activity: pelvic rest and No heavy lifting and no driving x2 weeks Diet: routine Medications: Ibuprofen, Percocet and  Tylenol Condition: stable and Delivered Instructions: refer to practice specific booklet Discharge to: home   Newborn Data: Live born female  Birth Weight: 7 lb 3.9 oz (3285 g) APGAR: 9, 9  Home with mother.  Mattison Golay H. 09/12/2011, 12:38 PM

## 2013-11-11 ENCOUNTER — Other Ambulatory Visit: Payer: Self-pay | Admitting: Otolaryngology

## 2013-12-26 ENCOUNTER — Encounter (HOSPITAL_COMMUNITY): Payer: Self-pay

## 2015-01-26 ENCOUNTER — Ambulatory Visit (INDEPENDENT_AMBULATORY_CARE_PROVIDER_SITE_OTHER): Payer: BLUE CROSS/BLUE SHIELD | Admitting: Family Medicine

## 2015-01-26 ENCOUNTER — Encounter: Payer: Self-pay | Admitting: Family Medicine

## 2015-01-26 VITALS — BP 120/78 | HR 64 | Ht 66.26 in | Wt 190.4 lb

## 2015-01-26 DIAGNOSIS — M542 Cervicalgia: Secondary | ICD-10-CM

## 2015-01-26 MED ORDER — CYCLOBENZAPRINE HCL 10 MG PO TABS: 10.0000 mg | ORAL_TABLET | Freq: Every day | ORAL | Status: DC

## 2015-01-26 NOTE — Progress Notes (Signed)
   Subjective:    Patient ID: Melinda [name], female    DOB: [birth year], 39 y.o.   MRN: [id number]  HPI Chief Complaint  Patient presents with  . new pt    new pt- neck pain- right shoulder blade. soreness in elbow. thanksgiving day reached up and hear a pop at base of skull and been hurting ever since.    She is new to the practice and here to establish primary care. Last physical exam was years ago. Last pap also.  She also has complaints of pain to right lateral neck, a "pulling sensation" to right shoulder blade area since 12/19/14 after reaching up to stretch and she felt a "pop" in neck. Denies weakness, numbness but does reports a "burning sensation" to back of right arm. Pain is worse with certain movements.   Reports she had an XR of neck and had space narrowing in cervical area and "torticollis". States pain is not as severe as in 2011. At that time she saw a chiropractor and had accupuncture and did physical therapy also. She saw an orthopedist for neck issues in [facility]. She states initial neck pain was due to bowling and neck positioning at work.   Other providers: [place] [facility] Dr. 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  Denies smoking, drug use, drinks occasionally.   Works as med Designer, [employer]. Lives with husband and 74 year old.   Reviewed allergies, medications, past medical and social history.  Review of Systems Pertinent positives and negatives in the history of present illness.    Objective:   Physical Exam  Constitutional: She appears well-developed and well-nourished. No distress.  Neck: Neck supple. Muscular tenderness present. No spinous process tenderness present. Decreased range of motion present. No edema present.  Musculoskeletal:       Cervical back: She exhibits decreased range of motion, tenderness, pain and spasm. She exhibits no swelling.  Right lateral neck tenderness, spasm, no erythema, rash, limited ROM due to  pain with flexion, right rotation and right lateral bending. Normal strength to neck and upper extremities.    BP 120/78 mmHg  Pulse 64  Ht 5' 6.26" (1.683 m)  Wt 190 lb 6.4 oz (86.365 kg)  BMI 30.49 kg/m2  LMP 01/20/2015  Breastfeeding? No     Assessment & Plan:  Neck pain, acute - Plan: cyclobenzaprine (FLEXERIL) 10 MG tablet  Suspect neck spasm, recommend continued use of heat and stretching, 2 aleve twice daily and adding flexeril at bedtime as needed. Discussed using good body mechanics and keeping her head in neutral. She will let me know in 3-4 days if no improvement and will consider physical therapy. She has been to chiropractor and PT in past for similar pain.

## 2015-01-31 ENCOUNTER — Telehealth: Payer: Self-pay | Admitting: Family Medicine

## 2015-01-31 NOTE — Telephone Encounter (Signed)
Pt called and wanted to let you know that her pain was getting a little better, and wants to know if she needs to continue to take flexeril at night and she is still taking the aleve, please advise

## 2015-01-31 NOTE — Telephone Encounter (Signed)
Let her know that she can stop the flexeril at night and continue on aleve if this is helping but I do not want her taking it more than a couple of weeks. If she continues to have neck pain we may consider X Ray and maybe physical therapy, especially if she has another flare up soon.  Also, is she scheduled to return for physical and labs? I recall that she is way overdue for this.

## 2015-01-31 NOTE — Telephone Encounter (Signed)
Pt notified of recommendations. She will call back and schedule cpe with labs

## 2015-06-19 DIAGNOSIS — R300 Dysuria: Secondary | ICD-10-CM | POA: Diagnosis not present

## 2015-06-19 DIAGNOSIS — N390 Urinary tract infection, site not specified: Secondary | ICD-10-CM | POA: Diagnosis not present

## 2015-08-06 DIAGNOSIS — R300 Dysuria: Secondary | ICD-10-CM | POA: Diagnosis not present

## 2015-10-18 ENCOUNTER — Encounter (HOSPITAL_COMMUNITY): Payer: Self-pay | Admitting: Emergency Medicine

## 2015-10-18 ENCOUNTER — Emergency Department (HOSPITAL_COMMUNITY)
Admission: EM | Admit: 2015-10-18 | Discharge: 2015-10-18 | Disposition: A | Discharge status: Home / Self Care | Payer: BLUE CROSS/BLUE SHIELD | Attending: Emergency Medicine | Admitting: Emergency Medicine

## 2015-10-18 DIAGNOSIS — O4691 Antepartum hemorrhage, unspecified, first trimester: Secondary | ICD-10-CM | POA: Insufficient documentation

## 2015-10-18 DIAGNOSIS — Z3A01 Less than 8 weeks gestation of pregnancy: Secondary | ICD-10-CM | POA: Diagnosis not present

## 2015-10-18 DIAGNOSIS — O0281 Inappropriate change in quantitative human chorionic gonadotropin (hCG) in early pregnancy: Secondary | ICD-10-CM | POA: Diagnosis not present

## 2015-10-18 DIAGNOSIS — O209 Hemorrhage in early pregnancy, unspecified: Secondary | ICD-10-CM | POA: Diagnosis not present

## 2015-10-18 DIAGNOSIS — Z91040 Latex allergy status: Secondary | ICD-10-CM | POA: Insufficient documentation

## 2015-10-18 LAB — URINALYSIS, ROUTINE W REFLEX MICROSCOPIC
Bilirubin Urine: NEGATIVE
Glucose, UA: NEGATIVE mg/dL
Ketones, ur: NEGATIVE mg/dL
Leukocytes, UA: NEGATIVE
Nitrite: NEGATIVE
Protein, ur: NEGATIVE mg/dL
Specific Gravity, Urine: 1.028 (ref 1.005–1.030)
pH: 6 (ref 5.0–8.0)

## 2015-10-18 LAB — URINE MICROSCOPIC-ADD ON

## 2015-10-18 LAB — HCG, QUANTITATIVE, PREGNANCY: hCG, Beta Chain, Quant, S: 8235 m[IU]/mL — ABNORMAL HIGH (ref <5)

## 2015-10-18 NOTE — ED Provider Notes (Signed)
[facility] DEPT Provider Note   CSN: [id number] Arrival date & time: 10/18/15  1914     History   Chief Complaint Chief Complaint  Patient presents with  . Vaginal Bleeding    HPI Melinda [name] is a 40 y.o. female.  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  Patient's LMP was 09/15/15.  She is G2/P1. First pregnancy was full term without prenatal complication.  She reports experiencing scant bright red blood on underwear yesterday, which changed to a brownish discharge.  She continues to see scant brownish discharge today.  She denies abdominal pain or urinary symptoms.   The history is provided by the patient.  Vaginal Bleeding  Primary symptoms include vaginal bleeding. There has been no fever. This is a new problem. The current episode started yesterday. The problem occurs daily. The problem has been gradually improving. She is pregnant. Pertinent negatives include no abdominal pain. There is no concern regarding sexually transmitted diseases.    Past Medical History:  Diagnosis Date  . Fibroid   . H/O varicella   . Herpes   . Seizures ([place])    less than 40 years old    There are no active problems to display for this patient.   Past Surgical History:  Procedure Laterality Date  . CESAREAN SECTION  09/10/2011   Procedure: CESAREAN SECTION;  Surgeon: Jolayne Haines, MD;  Location: [facility];  Service: Gynecology;  Laterality: N/A;  . NO PAST SURGERIES      OB History    Gravida Para Term Preterm AB Living   1 1 1  0 0 1   SAB TAB Ectopic Multiple Live Births   0 0 0 0 1       Home Medications    Prior to Admission medications   Medication Sig Start Date End Date Taking? Authorizing Provider  Prenatal Vit-Fe Fumarate-FA (PRENATAL MULTIVITAMIN) TABS tablet Take 1 tablet by mouth daily at 12 noon.   Yes Historical Provider, MD  cyclobenzaprine (FLEXERIL) 10 MG tablet Take 1 tablet (10 mg total) by mouth  at bedtime. Patient not taking: Reported on 10/18/2015 01/26/15   Girtha Rm, NP    Family History Family History  Problem Relation Age of Onset  . Goiter Maternal Grandmother   . Hypertension Maternal Grandmother   . Diabetes Maternal Grandfather   . Cancer Maternal Grandfather   . Stroke Maternal Grandfather     Social History Social History  Substance Use Topics  . Smoking status: Never Smoker  . Smokeless tobacco: Never Used  . Alcohol use No     Allergies   Latex   Review of Systems Review of Systems  Gastrointestinal: Negative for abdominal pain.  Genitourinary: Positive for vaginal bleeding.  All other systems reviewed and are negative.    Physical Exam Updated Vital Signs BP 121/75 (BP Location: Left Arm)   Pulse 109   Temp 98.7 F (37.1 C) (Oral)   Resp 20   Ht 5\' 6"  (1.676 m)   Wt 86.2 kg   LMP 09/15/2015 (Approximate)   SpO2 100%   BMI 30.67 kg/m   Physical Exam  Constitutional: She is oriented to person, place, and time. She appears well-developed and well-nourished.  HENT:  Head: Normocephalic.  Eyes: Pupils are equal, round, and reactive to light.  Neck: Neck supple.  Cardiovascular: Normal rate and regular rhythm.   Pulmonary/Chest: Effort normal and breath sounds normal.  Abdominal: Soft.  Bowel sounds are normal.  Musculoskeletal: She exhibits no edema.  Lymphadenopathy:    She has no cervical adenopathy.  Neurological: She is alert and oriented to person, place, and time.  Skin: Skin is warm and dry.  Psychiatric: She has a normal mood and affect.  Nursing note and vitals reviewed.    ED Treatments / Results  Labs (all labs ordered are listed, but only abnormal results are displayed) Labs Reviewed  URINALYSIS, ROUTINE W REFLEX MICROSCOPIC (NOT AT [facility]) - Abnormal; Notable for the following:       Result Value   Hgb urine dipstick SMALL (*)    All other components within normal limits  URINE MICROSCOPIC-ADD ON - Abnormal;  Notable for the following:    Squamous Epithelial / LPF 6-30 (*)    Bacteria, UA FEW (*)    All other components within normal limits  HCG, QUANTITATIVE, PREGNANCY    EKG  EKG Interpretation None       Radiology No results found.  Procedures Procedures (including critical care time)  Medications Ordered in ED Medications - No data to display   Initial Impression / Assessment and Plan / ED Course  I have reviewed the triage vital signs and the nursing notes.  Pertinent labs & imaging results that were available during my care of the patient were reviewed by me and considered in my medical decision making (see chart for details).  Clinical Course  Patient without abdominal pain. Spotting is likely physiologic.   No indication of UTI. Previous pregnancy full term without complication. No history of rH incompatibility.    Final Clinical Impressions(s) / ED Diagnoses   Final diagnoses:  None  Vaginal bleeding in the first trimester. Likely physiologic. Care instructions provided. Return precautions discussed. Patient will follow-up with her PCP/OB.  New Prescriptions New Prescriptions   No medications on file     Etta Quill, NP 10/18/15 [place], MD 10/24/15 [phone]

## 2015-10-18 NOTE — ED Notes (Signed)
Pt reports LMP 09/15/15.  Positive pregnancy test.  Yesterday afternoon noticed one occurrence of BRB on her underwear.  Since then has continued with brownish discharge.  Has not established care with OB as of yet and was advised by urgent care to be evaluated in the ED.  Husband and son at bedside

## 2015-10-18 NOTE — ED Triage Notes (Signed)
Pt. reports vaginal spotting to light vaginal bleeding onset 2 days ago , denies injury / no abdominal cramping , denies fever or chills.

## 2015-11-05 ENCOUNTER — Other Ambulatory Visit: Payer: Self-pay | Admitting: Obstetrics and Gynecology

## 2015-11-05 DIAGNOSIS — R109 Unspecified abdominal pain: Secondary | ICD-10-CM

## 2015-11-05 DIAGNOSIS — Z3480 Encounter for supervision of other normal pregnancy, unspecified trimester: Secondary | ICD-10-CM | POA: Diagnosis not present

## 2015-11-05 DIAGNOSIS — R58 Hemorrhage, not elsewhere classified: Secondary | ICD-10-CM

## 2015-11-08 ENCOUNTER — Ambulatory Visit (HOSPITAL_COMMUNITY)
Admission: RE | Admit: 2015-11-08 | Discharge: 2015-11-08 | Disposition: A | Discharge status: Home / Self Care | Payer: BLUE CROSS/BLUE SHIELD | Source: Ambulatory Visit | Attending: Obstetrics and Gynecology | Admitting: Obstetrics and Gynecology

## 2015-11-08 DIAGNOSIS — R58 Hemorrhage, not elsewhere classified: Secondary | ICD-10-CM

## 2015-11-08 DIAGNOSIS — R109 Unspecified abdominal pain: Secondary | ICD-10-CM | POA: Diagnosis not present

## 2015-11-08 DIAGNOSIS — O209 Hemorrhage in early pregnancy, unspecified: Secondary | ICD-10-CM | POA: Diagnosis not present

## 2015-11-08 DIAGNOSIS — Z3A08 8 weeks gestation of pregnancy: Secondary | ICD-10-CM | POA: Diagnosis not present

## 2015-11-21 DIAGNOSIS — O09521 Supervision of elderly multigravida, first trimester: Secondary | ICD-10-CM | POA: Diagnosis not present

## 2015-11-21 DIAGNOSIS — Z3A09 9 weeks gestation of pregnancy: Secondary | ICD-10-CM | POA: Diagnosis not present

## 2015-11-21 DIAGNOSIS — O209 Hemorrhage in early pregnancy, unspecified: Secondary | ICD-10-CM | POA: Diagnosis not present

## 2015-11-24 DIAGNOSIS — Z331 Pregnant state, incidental: Secondary | ICD-10-CM | POA: Diagnosis not present

## 2015-11-24 DIAGNOSIS — N390 Urinary tract infection, site not specified: Secondary | ICD-10-CM | POA: Diagnosis not present

## 2015-11-26 ENCOUNTER — Other Ambulatory Visit (HOSPITAL_COMMUNITY)
Admission: RE | Admit: 2015-11-26 | Discharge: 2015-11-26 | Disposition: A | Discharge status: Home / Self Care | Payer: BLUE CROSS/BLUE SHIELD | Source: Ambulatory Visit | Attending: Obstetrics and Gynecology | Admitting: Obstetrics and Gynecology

## 2015-11-26 ENCOUNTER — Other Ambulatory Visit: Payer: Self-pay | Admitting: Obstetrics and Gynecology

## 2015-11-26 DIAGNOSIS — O09521 Supervision of elderly multigravida, first trimester: Secondary | ICD-10-CM | POA: Diagnosis not present

## 2015-11-26 DIAGNOSIS — Z1151 Encounter for screening for human papillomavirus (HPV): Secondary | ICD-10-CM | POA: Diagnosis not present

## 2015-11-26 DIAGNOSIS — Z01419 Encounter for gynecological examination (general) (routine) without abnormal findings: Secondary | ICD-10-CM | POA: Insufficient documentation

## 2015-11-26 LAB — OB RESULTS CONSOLE HIV ANTIBODY (ROUTINE TESTING): HIV: NONREACTIVE

## 2015-11-26 LAB — OB RESULTS CONSOLE RUBELLA ANTIBODY, IGM: Rubella: IMMUNE

## 2015-11-26 LAB — OB RESULTS CONSOLE RPR: RPR: NONREACTIVE

## 2015-11-26 LAB — OB RESULTS CONSOLE GC/CHLAMYDIA
Chlamydia: NEGATIVE
Gonorrhea: NEGATIVE

## 2015-11-26 LAB — OB RESULTS CONSOLE HEPATITIS B SURFACE ANTIGEN: Hepatitis B Surface Ag: NEGATIVE

## 2015-11-27 LAB — CYTOLOGY - PAP

## 2015-11-28 DIAGNOSIS — Z3A10 10 weeks gestation of pregnancy: Secondary | ICD-10-CM | POA: Diagnosis not present

## 2015-11-28 DIAGNOSIS — O09521 Supervision of elderly multigravida, first trimester: Secondary | ICD-10-CM | POA: Diagnosis not present

## 2015-12-13 DIAGNOSIS — R300 Dysuria: Secondary | ICD-10-CM | POA: Diagnosis not present

## 2015-12-24 DIAGNOSIS — N760 Acute vaginitis: Secondary | ICD-10-CM | POA: Diagnosis not present

## 2015-12-24 DIAGNOSIS — O09521 Supervision of elderly multigravida, first trimester: Secondary | ICD-10-CM | POA: Diagnosis not present

## 2016-01-21 DIAGNOSIS — Z36 Encounter for antenatal screening for chromosomal anomalies: Secondary | ICD-10-CM | POA: Diagnosis not present

## 2016-01-21 DIAGNOSIS — Z3482 Encounter for supervision of other normal pregnancy, second trimester: Secondary | ICD-10-CM | POA: Diagnosis not present

## 2016-01-21 DIAGNOSIS — Z23 Encounter for immunization: Secondary | ICD-10-CM | POA: Diagnosis not present

## 2016-01-21 DIAGNOSIS — O09529 Supervision of elderly multigravida, unspecified trimester: Secondary | ICD-10-CM | POA: Diagnosis not present

## 2016-03-18 DIAGNOSIS — Z3480 Encounter for supervision of other normal pregnancy, unspecified trimester: Secondary | ICD-10-CM | POA: Diagnosis not present

## 2016-03-25 DIAGNOSIS — D696 Thrombocytopenia, unspecified: Secondary | ICD-10-CM | POA: Diagnosis not present

## 2016-05-02 ENCOUNTER — Inpatient Hospital Stay (HOSPITAL_COMMUNITY)
Admission: AD | Admit: 2016-05-02 | Discharge: 2016-05-02 | Disposition: A | Discharge status: Home / Self Care | Payer: BLUE CROSS/BLUE SHIELD | Source: Ambulatory Visit | Attending: Obstetrics & Gynecology | Admitting: Obstetrics & Gynecology

## 2016-05-02 ENCOUNTER — Encounter (HOSPITAL_COMMUNITY): Payer: Self-pay | Admitting: *Deleted

## 2016-05-02 DIAGNOSIS — O4690 Antepartum hemorrhage, unspecified, unspecified trimester: Secondary | ICD-10-CM

## 2016-05-02 DIAGNOSIS — N939 Abnormal uterine and vaginal bleeding, unspecified: Secondary | ICD-10-CM | POA: Diagnosis not present

## 2016-05-02 NOTE — Discharge Instructions (Signed)
Pelvic Rest Pelvic rest may be recommended if:  Your placenta is partially or completely covering the opening of your cervix (placenta previa).  There is bleeding between the wall of the uterus and the amniotic sac in the first trimester of pregnancy (subchorionic hemorrhage).  You went into labor too early (preterm labor). Based on your overall health and the health of your baby, your health care provider will decide if pelvic rest is right for you. How do I rest my pelvis? For as long as told by your health care provider:  Do not have sex, sexual stimulation, or an orgasm.  Do not use tampons. Do not douche. Do not put anything in your vagina.  Do not lift anything that is heavier than 10 lb (4.5 kg).  Avoid activities that take a lot of effort (are strenuous).  Avoid any activity in which your pelvic muscles could become strained. When should I seek medical care? Seek medical care if you have:  Cramping pain in your lower abdomen.  Vaginal discharge.  A low, dull backache.  Regular contractions.  Uterine tightening. When should I seek immediate medical care? Seek immediate medical care if:  You have vaginal bleeding and you are pregnant. This information is not intended to replace advice given to you by your health care provider. Make sure you discuss any questions you have with your health care provider. Document Released: 06/07/2010 Document Revised: 07/19/2015 Document Reviewed: 08/14/2014 Elsevier Interactive Patient Education  2017 [employer].

## 2016-05-02 NOTE — MAU Provider Note (Signed)
History     CSN: [id number]  Arrival date and time: 05/02/16 2106   None     Chief Complaint  Patient presents with  . Vaginal Bleeding   Vaginal Bleeding  The patient's primary symptoms include vaginal bleeding. The patient's pertinent negatives include no genital itching, genital lesions or genital odor. This is a new problem. The current episode started today. The problem occurs rarely. The problem has been resolved. The pain is mild. Associated symptoms include abdominal pain. Pertinent negatives include no anorexia, back pain, chills, constipation, diarrhea, discolored urine, dysuria, fever, flank pain, frequency, headaches, hematuria, joint pain, urgency or vomiting. The vaginal discharge was bloody. She has been passing clots (1 kiwi-sized clot at about 9 pm on her underwear while she was standing. Last intercourse two days ago. ). She has not been passing tissue. Nothing aggravates the symptoms. She has tried nothing for the symptoms.    OB History    Gravida Para Term Preterm AB Living   2 1 1  0 0 1   SAB TAB Ectopic Multiple Live Births   0 0 0 0 1      Past Medical History:  Diagnosis Date  . Fibroid   . H/O varicella   . Herpes   . Seizures ([place])    less than 16 years old    Past Surgical History:  Procedure Laterality Date  . CESAREAN SECTION  09/10/2011   Procedure: CESAREAN SECTION;  Surgeon: Jolayne Haines, MD;  Location: [facility];  Service: Gynecology;  Laterality: N/A;    Family History  Problem Relation Age of Onset  . Goiter Maternal Grandmother   . Hypertension Maternal Grandmother   . Diabetes Maternal Grandfather   . Cancer Maternal Grandfather   . Stroke Maternal Grandfather     Social History  Substance Use Topics  . Smoking status: Never Smoker  . Smokeless tobacco: Never Used  . Alcohol use No    Allergies:  Allergies  Allergen Reactions  . Latex Rash    Prescriptions Prior to Admission  Medication Sig Dispense Refill Last Dose  .  Prenatal Vit-Fe Fumarate-FA (PRENATAL MULTIVITAMIN) TABS tablet Take 1 tablet by mouth daily at 12 noon.   05/01/2016 at Unknown time    Review of Systems  Constitutional: Negative for chills and fever.  Respiratory: Negative.   Gastrointestinal: Positive for abdominal pain. Negative for anorexia, constipation, diarrhea and vomiting.       She has some mild backache that she rates a .5/10. She also had a few cramps when the blood clot passed but none now.   Genitourinary: Positive for vaginal bleeding. Negative for dysuria, flank pain, frequency, hematuria and urgency.  Musculoskeletal: Negative for back pain and joint pain.  Neurological: Negative for headaches.   Physical Exam   Blood pressure 112/62, pulse 115, temperature 98.6 F (37 C), temperature source Oral, resp. rate 18, last menstrual period 09/15/2015.  Physical Exam  Constitutional: She is oriented to person, place, and time. She appears well-developed and well-nourished.  HENT:  Head: Normocephalic.  Neck: Normal range of motion.  Respiratory: Effort normal.  GI: Soft. Bowel sounds are normal. She exhibits no distension and no mass. There is no tenderness. There is no rebound and no guarding.  Genitourinary:  Genitourinary Comments: NEFG. No active bleeding from the cervix, one small streak of dark brown blood extruding from the cervical os. No CMT, no suprapubic tenderness. Cervic is closed and thick.   Musculoskeletal: Normal range of motion.  Neurological: She is alert and oriented to person, place, and time. She has normal reflexes.  Skin: Skin is warm and dry.  Psychiatric: She has a normal mood and affect.    MAU Course  Procedures Vitals:   05/02/16 2128 05/02/16 2213  BP: 112/62 108/56  Pulse: 115   Resp: 18   Temp: 98.6 F (37 C)     MDM -NST: 135 bpm with moderate variability, present acel, no decels and no contractions.  -No active bleeding in MAU Assessment and Plan   1. Vaginal bleeding during  pregnancy, antepartum    2. Patient stable for discharge with recommendation for pelvic rest and to follow up in the office this week. Reviewed when to return to the MAU (more bleeding, leaking of fluid, decreased fetal movements) 3. Plan of care discussed with Dr. Nelda Marseille, who agrees.    Mervyn Skeeters [employer] CNM 05/02/2016, 10:09 PM

## 2016-05-02 NOTE — MAU Note (Signed)
Standing frosting cupcakes. Sat down because feet and ankles started to swell.  When walking from from kitchen felt gush of flush.  Went to bathroom and noticed a large blood clot and underwear were filled with bright red blood

## 2016-05-22 LAB — OB RESULTS CONSOLE GBS: GBS: NEGATIVE

## 2016-06-05 DIAGNOSIS — O99113 Other diseases of the blood and blood-forming organs and certain disorders involving the immune mechanism complicating pregnancy, third trimester: Secondary | ICD-10-CM | POA: Diagnosis not present

## 2016-06-05 DIAGNOSIS — D696 Thrombocytopenia, unspecified: Secondary | ICD-10-CM | POA: Diagnosis not present

## 2016-06-05 DIAGNOSIS — O09523 Supervision of elderly multigravida, third trimester: Secondary | ICD-10-CM | POA: Diagnosis not present

## 2016-06-09 DIAGNOSIS — O99113 Other diseases of the blood and blood-forming organs and certain disorders involving the immune mechanism complicating pregnancy, third trimester: Secondary | ICD-10-CM | POA: Diagnosis not present

## 2016-06-09 DIAGNOSIS — D696 Thrombocytopenia, unspecified: Secondary | ICD-10-CM | POA: Diagnosis not present

## 2016-06-19 DIAGNOSIS — O09523 Supervision of elderly multigravida, third trimester: Secondary | ICD-10-CM | POA: Diagnosis not present

## 2016-06-19 DIAGNOSIS — D696 Thrombocytopenia, unspecified: Secondary | ICD-10-CM | POA: Diagnosis not present

## 2016-06-25 ENCOUNTER — Encounter (HOSPITAL_COMMUNITY)
Admission: AD | Disposition: A | Discharge status: Home / Self Care | Payer: Self-pay | Source: Ambulatory Visit | Attending: Obstetrics and Gynecology

## 2016-06-25 ENCOUNTER — Encounter (HOSPITAL_COMMUNITY): Payer: Self-pay | Admitting: *Deleted

## 2016-06-25 ENCOUNTER — Inpatient Hospital Stay (HOSPITAL_COMMUNITY)
Admission: AD | Admit: 2016-06-25 | Discharge: 2016-06-28 | DRG: 765 | Disposition: A | Discharge status: Home / Self Care | Payer: BLUE CROSS/BLUE SHIELD | Source: Ambulatory Visit | Attending: Obstetrics and Gynecology | Admitting: Obstetrics and Gynecology

## 2016-06-25 ENCOUNTER — Inpatient Hospital Stay (HOSPITAL_COMMUNITY): Payer: BLUE CROSS/BLUE SHIELD | Admitting: Anesthesiology

## 2016-06-25 DIAGNOSIS — D6959 Other secondary thrombocytopenia: Secondary | ICD-10-CM | POA: Diagnosis present

## 2016-06-25 DIAGNOSIS — Z98891 History of uterine scar from previous surgery: Secondary | ICD-10-CM

## 2016-06-25 DIAGNOSIS — O34219 Maternal care for unspecified type scar from previous cesarean delivery: Secondary | ICD-10-CM | POA: Diagnosis not present

## 2016-06-25 DIAGNOSIS — O9912 Other diseases of the blood and blood-forming organs and certain disorders involving the immune mechanism complicating childbirth: Secondary | ICD-10-CM | POA: Diagnosis not present

## 2016-06-25 DIAGNOSIS — Z3A40 40 weeks gestation of pregnancy: Secondary | ICD-10-CM | POA: Diagnosis not present

## 2016-06-25 DIAGNOSIS — O34211 Maternal care for low transverse scar from previous cesarean delivery: Secondary | ICD-10-CM | POA: Diagnosis not present

## 2016-06-25 DIAGNOSIS — Z8249 Family history of ischemic heart disease and other diseases of the circulatory system: Secondary | ICD-10-CM | POA: Diagnosis not present

## 2016-06-25 DIAGNOSIS — Z833 Family history of diabetes mellitus: Secondary | ICD-10-CM | POA: Diagnosis not present

## 2016-06-25 DIAGNOSIS — Z3493 Encounter for supervision of normal pregnancy, unspecified, third trimester: Secondary | ICD-10-CM | POA: Diagnosis not present

## 2016-06-25 DIAGNOSIS — O43813 Placental infarction, third trimester: Secondary | ICD-10-CM | POA: Diagnosis not present

## 2016-06-25 LAB — CBC
HCT: 37 % (ref 36.0–46.0)
HCT: 26.8 % — ABNORMAL LOW (ref 36.0–46.0)
Hemoglobin: 12.6 g/dL (ref 12.0–15.0)
Hemoglobin: 9 g/dL — ABNORMAL LOW (ref 12.0–15.0)
MCH: 31 pg (ref 26.0–34.0)
MCH: 31.3 pg (ref 26.0–34.0)
MCHC: 34.1 g/dL (ref 30.0–36.0)
MCHC: 33.6 g/dL (ref 30.0–36.0)
MCV: 91.1 fL (ref 78.0–100.0)
MCV: 93.1 fL (ref 78.0–100.0)
Platelets: 125 10*3/uL — ABNORMAL LOW (ref 150–400)
Platelets: 93 10*3/uL — ABNORMAL LOW (ref 150–400)
RBC: 4.06 MIL/uL (ref 3.87–5.11)
RBC: 2.88 MIL/uL — ABNORMAL LOW (ref 3.87–5.11)
RDW: 14.6 % (ref 11.5–15.5)
RDW: 14.8 % (ref 11.5–15.5)
WBC: 9.6 10*3/uL (ref 4.0–10.5)
WBC: 14.3 10*3/uL — ABNORMAL HIGH (ref 4.0–10.5)

## 2016-06-25 LAB — PREPARE RBC (CROSSMATCH)

## 2016-06-25 SURGERY — Surgical Case
Anesthesia: Epidural

## 2016-06-25 MED ORDER — FENTANYL 2.5 MCG/ML BUPIVACAINE 1/10 % EPIDURAL INFUSION (WH - ANES)
14.0000 mL/h | INTRAMUSCULAR | Status: DC | PRN
  Administered 2016-06-25: 14 mL/h via EPIDURAL
  Filled 2016-06-25: qty 100

## 2016-06-25 MED ORDER — LACTATED RINGERS IV SOLN
INTRAVENOUS | Status: DC | PRN
  Administered 2016-06-25: 13:00:00 via INTRAVENOUS

## 2016-06-25 MED ORDER — DIBUCAINE 1 % RE OINT: 1.0000 "application " | TOPICAL_OINTMENT | RECTAL | Status: DC | PRN

## 2016-06-25 MED ORDER — MORPHINE SULFATE (PF) 0.5 MG/ML IJ SOLN
INTRAMUSCULAR | Status: DC | PRN
  Administered 2016-06-25: 4 mg via EPIDURAL

## 2016-06-25 MED ORDER — DIPHENHYDRAMINE HCL 50 MG/ML IJ SOLN: 12.5000 mg | INTRAMUSCULAR | Status: DC | PRN

## 2016-06-25 MED ORDER — SODIUM CHLORIDE 0.9% FLUSH: 3.0000 mL | INTRAVENOUS | Status: DC | PRN

## 2016-06-25 MED ORDER — FLEET ENEMA 7-19 GM/118ML RE ENEM: 1.0000 | ENEMA | RECTAL | Status: DC | PRN

## 2016-06-25 MED ORDER — HYDROMORPHONE HCL 1 MG/ML IJ SOLN: 0.2500 mg | INTRAMUSCULAR | Status: DC | PRN

## 2016-06-25 MED ORDER — SCOPOLAMINE 1 MG/3DAYS TD PT72
1.0000 | MEDICATED_PATCH | Freq: Once | TRANSDERMAL | Status: DC
  Administered 2016-06-25: 1.5 mg via TRANSDERMAL
  Filled 2016-06-25 (×2): qty 1

## 2016-06-25 MED ORDER — MENTHOL 3 MG MT LOZG: 1.0000 | LOZENGE | OROMUCOSAL | Status: DC | PRN

## 2016-06-25 MED ORDER — CEFAZOLIN SODIUM-DEXTROSE 2-3 GM-% IV SOLR
INTRAVENOUS | Status: DC | PRN
  Administered 2016-06-25: 2 g via INTRAVENOUS

## 2016-06-25 MED ORDER — ZOLPIDEM TARTRATE 5 MG PO TABS: 5.0000 mg | ORAL_TABLET | Freq: Every evening | ORAL | Status: DC | PRN

## 2016-06-25 MED ORDER — DIPHENHYDRAMINE HCL 25 MG PO CAPS: 25.0000 mg | ORAL_CAPSULE | Freq: Four times a day (QID) | ORAL | Status: DC | PRN

## 2016-06-25 MED ORDER — DIPHENHYDRAMINE HCL 25 MG PO CAPS: 25.0000 mg | ORAL_CAPSULE | ORAL | Status: DC | PRN

## 2016-06-25 MED ORDER — OXYTOCIN 40 UNITS IN LACTATED RINGERS INFUSION - SIMPLE MED: 2.5000 [IU]/h | INTRAVENOUS | Status: DC

## 2016-06-25 MED ORDER — OXYTOCIN BOLUS FROM INFUSION: 500.0000 mL | Freq: Once | INTRAVENOUS | Status: DC

## 2016-06-25 MED ORDER — LACTATED RINGERS IV SOLN
500.0000 mL | INTRAVENOUS | Status: DC | PRN
  Administered 2016-06-25: 1000 mL via INTRAVENOUS

## 2016-06-25 MED ORDER — LIDOCAINE HCL (PF) 1 % IJ SOLN: 30.0000 mL | INTRAMUSCULAR | Status: DC | PRN

## 2016-06-25 MED ORDER — NALBUPHINE HCL 10 MG/ML IJ SOLN: 5.0000 mg | Freq: Once | INTRAMUSCULAR | Status: DC | PRN

## 2016-06-25 MED ORDER — PHENYLEPHRINE 40 MCG/ML (10ML) SYRINGE FOR IV PUSH (FOR BLOOD PRESSURE SUPPORT)
PREFILLED_SYRINGE | INTRAVENOUS | Status: AC
  Filled 2016-06-25: qty 10

## 2016-06-25 MED ORDER — ACETAMINOPHEN 325 MG PO TABS
650.0000 mg | ORAL_TABLET | ORAL | Status: DC | PRN
  Administered 2016-06-26 – 2016-06-27 (×3): 650 mg via ORAL
  Filled 2016-06-25 (×3): qty 2

## 2016-06-25 MED ORDER — PHENYLEPHRINE HCL 10 MG/ML IJ SOLN
INTRAMUSCULAR | Status: DC | PRN
  Administered 2016-06-25 (×2): 40 ug via INTRAVENOUS

## 2016-06-25 MED ORDER — OXYCODONE-ACETAMINOPHEN 5-325 MG PO TABS: 2.0000 | ORAL_TABLET | ORAL | Status: DC | PRN

## 2016-06-25 MED ORDER — ONDANSETRON HCL 4 MG/2ML IJ SOLN
INTRAMUSCULAR | Status: AC
  Filled 2016-06-25: qty 2

## 2016-06-25 MED ORDER — OXYTOCIN 40 UNITS IN LACTATED RINGERS INFUSION - SIMPLE MED: 2.5000 [IU]/h | INTRAVENOUS | Status: AC

## 2016-06-25 MED ORDER — NALBUPHINE HCL 10 MG/ML IJ SOLN: 5.0000 mg | INTRAMUSCULAR | Status: DC | PRN

## 2016-06-25 MED ORDER — SODIUM BICARBONATE 8.4 % IV SOLN
INTRAVENOUS | Status: DC | PRN
  Administered 2016-06-25: 5 mL via EPIDURAL
  Administered 2016-06-25: 2 mL via EPIDURAL
  Administered 2016-06-25: 5 mL via EPIDURAL
  Administered 2016-06-25 (×2): 3 mL via EPIDURAL

## 2016-06-25 MED ORDER — KETOROLAC TROMETHAMINE 30 MG/ML IJ SOLN
30.0000 mg | Freq: Four times a day (QID) | INTRAMUSCULAR | Status: AC | PRN
  Administered 2016-06-25: 30 mg via INTRAVENOUS
  Filled 2016-06-25: qty 1

## 2016-06-25 MED ORDER — SCOPOLAMINE 1 MG/3DAYS TD PT72: 1.0000 | MEDICATED_PATCH | Freq: Once | TRANSDERMAL | Status: DC

## 2016-06-25 MED ORDER — MORPHINE SULFATE (PF) 0.5 MG/ML IJ SOLN
INTRAMUSCULAR | Status: AC
  Filled 2016-06-25: qty 10

## 2016-06-25 MED ORDER — SODIUM CHLORIDE 0.9 % IV SOLN: 10.0000 mL/h | Freq: Once | INTRAVENOUS | Status: DC

## 2016-06-25 MED ORDER — LACTATED RINGERS IV SOLN: 500.0000 mL | Freq: Once | INTRAVENOUS | Status: DC

## 2016-06-25 MED ORDER — METHYLERGONOVINE MALEATE 0.2 MG PO TABS: 0.2000 mg | ORAL_TABLET | Freq: Four times a day (QID) | ORAL | Status: DC | PRN

## 2016-06-25 MED ORDER — FENTANYL CITRATE (PF) 100 MCG/2ML IJ SOLN
INTRAMUSCULAR | Status: AC
  Filled 2016-06-25: qty 2

## 2016-06-25 MED ORDER — SIMETHICONE 80 MG PO CHEW
80.0000 mg | CHEWABLE_TABLET | ORAL | Status: DC
  Administered 2016-06-25 – 2016-06-28 (×3): 80 mg via ORAL
  Filled 2016-06-25 (×3): qty 1

## 2016-06-25 MED ORDER — KETOROLAC TROMETHAMINE 30 MG/ML IJ SOLN: 30.0000 mg | Freq: Four times a day (QID) | INTRAMUSCULAR | Status: DC | PRN

## 2016-06-25 MED ORDER — MEPERIDINE HCL 25 MG/ML IJ SOLN: 6.2500 mg | INTRAMUSCULAR | Status: DC | PRN

## 2016-06-25 MED ORDER — OXYCODONE-ACETAMINOPHEN 5-325 MG PO TABS: 1.0000 | ORAL_TABLET | ORAL | Status: DC | PRN

## 2016-06-25 MED ORDER — NALOXONE HCL 2 MG/2ML IJ SOSY
1.0000 ug/kg/h | PREFILLED_SYRINGE | INTRAMUSCULAR | Status: DC | PRN
  Filled 2016-06-25: qty 2

## 2016-06-25 MED ORDER — EPHEDRINE 5 MG/ML INJ: 10.0000 mg | INTRAVENOUS | Status: DC | PRN

## 2016-06-25 MED ORDER — TERBUTALINE SULFATE 1 MG/ML IJ SOLN
INTRAMUSCULAR | Status: AC
  Administered 2016-06-25: 0.25 mg
  Filled 2016-06-25: qty 1

## 2016-06-25 MED ORDER — KETOROLAC TROMETHAMINE 30 MG/ML IJ SOLN
30.0000 mg | Freq: Four times a day (QID) | INTRAMUSCULAR | Status: AC | PRN
  Filled 2016-06-25: qty 1

## 2016-06-25 MED ORDER — OXYTOCIN 10 UNIT/ML IJ SOLN
INTRAMUSCULAR | Status: AC
  Filled 2016-06-25: qty 4

## 2016-06-25 MED ORDER — SIMETHICONE 80 MG PO CHEW
80.0000 mg | CHEWABLE_TABLET | Freq: Three times a day (TID) | ORAL | Status: DC
  Administered 2016-06-26 – 2016-06-28 (×6): 80 mg via ORAL
  Filled 2016-06-25 (×6): qty 1

## 2016-06-25 MED ORDER — PHENYLEPHRINE 40 MCG/ML (10ML) SYRINGE FOR IV PUSH (FOR BLOOD PRESSURE SUPPORT)
80.0000 ug | PREFILLED_SYRINGE | INTRAVENOUS | Status: DC | PRN
  Filled 2016-06-25: qty 10

## 2016-06-25 MED ORDER — SIMETHICONE 80 MG PO CHEW: 80.0000 mg | CHEWABLE_TABLET | ORAL | Status: DC | PRN

## 2016-06-25 MED ORDER — ONDANSETRON HCL 4 MG/2ML IJ SOLN
4.0000 mg | Freq: Four times a day (QID) | INTRAMUSCULAR | Status: DC | PRN
  Administered 2016-06-25: 4 mg via INTRAVENOUS
  Filled 2016-06-25: qty 2

## 2016-06-25 MED ORDER — ALBUMIN HUMAN 5 % IV SOLN
INTRAVENOUS | Status: AC
  Filled 2016-06-25: qty 250

## 2016-06-25 MED ORDER — LACTATED RINGERS IV SOLN
INTRAVENOUS | Status: DC
  Administered 2016-06-25: 11:00:00 via INTRAVENOUS

## 2016-06-25 MED ORDER — MEPERIDINE HCL 25 MG/ML IJ SOLN
INTRAMUSCULAR | Status: DC | PRN
  Administered 2016-06-25: 25 mg via INTRAVENOUS

## 2016-06-25 MED ORDER — METHYLERGONOVINE MALEATE 0.2 MG/ML IJ SOLN: 0.2000 mg | INTRAMUSCULAR | Status: DC | PRN

## 2016-06-25 MED ORDER — WITCH HAZEL-GLYCERIN EX PADS: 1.0000 "application " | MEDICATED_PAD | CUTANEOUS | Status: DC | PRN

## 2016-06-25 MED ORDER — ACETAMINOPHEN 325 MG PO TABS: 650.0000 mg | ORAL_TABLET | ORAL | Status: DC | PRN

## 2016-06-25 MED ORDER — SOD CITRATE-CITRIC ACID 500-334 MG/5ML PO SOLN
30.0000 mL | ORAL | Status: DC | PRN
  Administered 2016-06-25: 30 mL via ORAL
  Filled 2016-06-25: qty 15

## 2016-06-25 MED ORDER — BUTORPHANOL TARTRATE 1 MG/ML IJ SOLN: 1.0000 mg | INTRAMUSCULAR | Status: DC | PRN

## 2016-06-25 MED ORDER — CEFAZOLIN SODIUM-DEXTROSE 2-4 GM/100ML-% IV SOLN
INTRAVENOUS | Status: AC
  Filled 2016-06-25: qty 100

## 2016-06-25 MED ORDER — METHYLERGONOVINE MALEATE 0.2 MG PO TABS: 0.2000 mg | ORAL_TABLET | ORAL | Status: DC | PRN

## 2016-06-25 MED ORDER — COCONUT OIL OIL: 1.0000 "application " | TOPICAL_OIL | Status: DC | PRN

## 2016-06-25 MED ORDER — NALOXONE HCL 0.4 MG/ML IJ SOLN: 0.4000 mg | INTRAMUSCULAR | Status: DC | PRN

## 2016-06-25 MED ORDER — IBUPROFEN 600 MG PO TABS
600.0000 mg | ORAL_TABLET | Freq: Four times a day (QID) | ORAL | Status: DC
  Administered 2016-06-26 – 2016-06-28 (×9): 600 mg via ORAL
  Filled 2016-06-25 (×9): qty 1

## 2016-06-25 MED ORDER — PROMETHAZINE HCL 25 MG/ML IJ SOLN: 6.2500 mg | INTRAMUSCULAR | Status: DC | PRN

## 2016-06-25 MED ORDER — PRENATAL MULTIVITAMIN CH
1.0000 | ORAL_TABLET | Freq: Every day | ORAL | Status: DC
  Administered 2016-06-26 – 2016-06-28 (×3): 1 via ORAL
  Filled 2016-06-25 (×3): qty 1

## 2016-06-25 MED ORDER — TETANUS-DIPHTH-ACELL PERTUSSIS 5-2.5-18.5 LF-MCG/0.5 IM SUSP: 0.5000 mL | Freq: Once | INTRAMUSCULAR | Status: DC

## 2016-06-25 MED ORDER — DIPHENHYDRAMINE HCL 25 MG PO CAPS
25.0000 mg | ORAL_CAPSULE | ORAL | Status: DC | PRN
  Filled 2016-06-25: qty 1

## 2016-06-25 MED ORDER — PHENYLEPHRINE 40 MCG/ML (10ML) SYRINGE FOR IV PUSH (FOR BLOOD PRESSURE SUPPORT): 80.0000 ug | PREFILLED_SYRINGE | INTRAVENOUS | Status: DC | PRN

## 2016-06-25 MED ORDER — ONDANSETRON HCL 4 MG/2ML IJ SOLN
4.0000 mg | Freq: Three times a day (TID) | INTRAMUSCULAR | Status: DC | PRN
  Administered 2016-06-25: 4 mg via INTRAVENOUS
  Filled 2016-06-25 (×2): qty 2

## 2016-06-25 MED ORDER — OXYTOCIN 10 UNIT/ML IJ SOLN
INTRAMUSCULAR | Status: DC | PRN
  Administered 2016-06-25: 40 [IU] via INTRAVENOUS

## 2016-06-25 MED ORDER — SENNOSIDES-DOCUSATE SODIUM 8.6-50 MG PO TABS
2.0000 | ORAL_TABLET | ORAL | Status: DC
  Administered 2016-06-27 – 2016-06-28 (×2): 2 via ORAL
  Filled 2016-06-25 (×3): qty 2

## 2016-06-25 MED ORDER — MEPERIDINE HCL 25 MG/ML IJ SOLN
INTRAMUSCULAR | Status: AC
  Filled 2016-06-25: qty 1

## 2016-06-25 MED ORDER — LACTATED RINGERS IV SOLN
INTRAVENOUS | Status: DC | PRN
  Administered 2016-06-25 (×3): via INTRAVENOUS

## 2016-06-25 MED ORDER — LIDOCAINE HCL (PF) 1 % IJ SOLN
INTRAMUSCULAR | Status: DC | PRN
  Administered 2016-06-25 (×2): 4 mL

## 2016-06-25 MED ORDER — LACTATED RINGERS IV SOLN
INTRAVENOUS | Status: DC
  Administered 2016-06-25 – 2016-06-26 (×3): via INTRAVENOUS

## 2016-06-25 MED ORDER — ALBUMIN HUMAN 5 % IV SOLN
INTRAVENOUS | Status: DC | PRN
  Administered 2016-06-25: 13:00:00 via INTRAVENOUS

## 2016-06-25 MED ORDER — FENTANYL CITRATE (PF) 100 MCG/2ML IJ SOLN
INTRAMUSCULAR | Status: DC | PRN
  Administered 2016-06-25: 100 ug via EPIDURAL

## 2016-06-25 MED ORDER — NALOXONE HCL 2 MG/2ML IJ SOSY
1.0000 ug/kg/h | PREFILLED_SYRINGE | INTRAVENOUS | Status: DC | PRN
  Filled 2016-06-25: qty 2

## 2016-06-25 MED ORDER — ONDANSETRON HCL 4 MG/2ML IJ SOLN: 4.0000 mg | Freq: Three times a day (TID) | INTRAMUSCULAR | Status: DC | PRN

## 2016-06-25 MED ORDER — LACTATED RINGERS IV SOLN
INTRAVENOUS | Status: DC
  Administered 2016-06-25: 11:00:00 via INTRAUTERINE

## 2016-06-25 SURGICAL SUPPLY — 38 items
BARRIER ADHS 3X4 INTERCEED (GAUZE/BANDAGES/DRESSINGS) ×4 IMPLANT
BENZOIN TINCTURE PRP APPL 2/3 (GAUZE/BANDAGES/DRESSINGS) IMPLANT
CHLORAPREP W/TINT 26ML (MISCELLANEOUS) ×2 IMPLANT
CLAMP CORD UMBIL (MISCELLANEOUS) IMPLANT
CLOTH BEACON ORANGE TIMEOUT ST (SAFETY) ×2 IMPLANT
DERMABOND ADHESIVE PROPEN (GAUZE/BANDAGES/DRESSINGS) ×1
DERMABOND ADVANCED (GAUZE/BANDAGES/DRESSINGS) ×1
DERMABOND ADVANCED .7 DNX12 (GAUZE/BANDAGES/DRESSINGS) ×1 IMPLANT
DERMABOND ADVANCED .7 DNX6 (GAUZE/BANDAGES/DRESSINGS) ×1 IMPLANT
DRSG OPSITE POSTOP 4X10 (GAUZE/BANDAGES/DRESSINGS) ×2 IMPLANT
ELECT REM PT RETURN 9FT ADLT (ELECTROSURGICAL) ×2
ELECTRODE REM PT RTRN 9FT ADLT (ELECTROSURGICAL) ×1 IMPLANT
EXTRACTOR VACUUM BELL STYLE (SUCTIONS) IMPLANT
GLOVE BIO SURGEON STRL SZ7 (GLOVE) ×2 IMPLANT
GLOVE BIOGEL PI IND STRL 7.0 (GLOVE) ×2 IMPLANT
GLOVE BIOGEL PI INDICATOR 7.0 (GLOVE) ×2
GOWN STRL REUS W/TWL LRG LVL3 (GOWN DISPOSABLE) ×4 IMPLANT
KIT ABG SYR 3ML LUER SLIP (SYRINGE) IMPLANT
NEEDLE HYPO 25X5/8 SAFETYGLIDE (NEEDLE) IMPLANT
NS IRRIG 1000ML POUR BTL (IV SOLUTION) ×2 IMPLANT
PACK C SECTION WH (CUSTOM PROCEDURE TRAY) ×2 IMPLANT
PAD ABD 7.5X8 STRL (GAUZE/BANDAGES/DRESSINGS) ×2 IMPLANT
PAD OB MATERNITY 4.3X12.25 (PERSONAL CARE ITEMS) ×2 IMPLANT
PENCIL SMOKE EVAC W/HOLSTER (ELECTROSURGICAL) ×2 IMPLANT
RTRCTR C-SECT PINK 25CM LRG (MISCELLANEOUS) ×2 IMPLANT
SPONGE DRAIN TRACH 4X4 STRL 2S (GAUZE/BANDAGES/DRESSINGS) ×4 IMPLANT
SPONGE LAP 18X18 X RAY DECT (DISPOSABLE) ×4 IMPLANT
STRIP CLOSURE SKIN 1/2X4 (GAUZE/BANDAGES/DRESSINGS) ×2 IMPLANT
SUT CHROMIC 0 CTX 36 (SUTURE) IMPLANT
SUT MON AB 4-0 PS1 27 (SUTURE) ×2 IMPLANT
SUT PLAIN 0 NONE (SUTURE) IMPLANT
SUT PLAIN 2 0 XLH (SUTURE) ×2 IMPLANT
SUT VIC AB 0 CTX 36 (SUTURE) ×5
SUT VIC AB 0 CTX36XBRD ANBCTRL (SUTURE) ×5 IMPLANT
SUT VIC AB 2-0 CT1 27 (SUTURE) ×1
SUT VIC AB 2-0 CT1 TAPERPNT 27 (SUTURE) ×1 IMPLANT
TOWEL OR 17X24 6PK STRL BLUE (TOWEL DISPOSABLE) ×2 IMPLANT
TRAY FOLEY BAG SILVER LF 14FR (SET/KITS/TRAYS/PACK) IMPLANT

## 2016-06-25 NOTE — Anesthesia Procedure Notes (Signed)
Epidural Patient location during procedure: OB  Staffing Anesthesiologist: Nolon Nations Performed: anesthesiologist   Preanesthetic Checklist Completed: patient identified, pre-op evaluation, timeout performed, IV checked, risks and benefits discussed and monitors and equipment checked  Epidural Patient position: sitting Prep: site prepped and draped and DuraPrep Patient monitoring: heart rate, blood pressure and continuous pulse ox Approach: midline Location: L3-L4 Injection technique: LOR air and LOR saline  Needle:  Needle type: Tuohy  Needle gauge: 17 G Needle length: 9 cm Needle insertion depth: 7 cm Catheter type: closed end flexible Catheter size: 19 Gauge Catheter at skin depth: 13 cm Test dose: negative  Assessment Sensory level: T8 Events: blood not aspirated, injection not painful, no injection resistance, negative IV test and no paresthesia  Additional Notes Reason for block:procedure for pain

## 2016-06-25 NOTE — Addendum Note (Signed)
Addendum  created 06/25/16 1845 by Effie Berkshire, MD   Order list changed, Order sets accessed

## 2016-06-25 NOTE — Anesthesia Postprocedure Evaluation (Signed)
Anesthesia Post Note  Patient: Melinda [name]  Procedure(s) Performed: Procedure(s) (LRB): CESAREAN SECTION (N/A)  Patient location during evaluation: Mother Baby Anesthesia Type: Epidural Level of consciousness: awake and alert Pain management: pain level controlled Vital Signs Assessment: post-procedure vital signs reviewed and stable Respiratory status: spontaneous breathing, nonlabored ventilation and respiratory function stable Cardiovascular status: stable Postop Assessment: no headache, no backache and epidural receding Anesthetic complications: no        Last Vitals:  Vitals:   06/25/16 1836 06/25/16 1950  BP: (!) 117/51 113/68  Pulse: (!) 102 83  Resp: 18 18  Temp:  36.6 C    Last Pain:  Vitals:   06/25/16 1950  TempSrc: Oral  PainSc: 4    Pain Goal:                 [employer]

## 2016-06-25 NOTE — Transfer of Care (Signed)
Immediate Anesthesia Transfer of Care Note  Patient: Melinda [name]  Procedure(s) Performed: Procedure(s): CESAREAN SECTION (N/A)  Patient Location: PACU  Anesthesia Type:Epidural  Level of Consciousness: awake, alert , oriented and patient cooperative  Airway & Oxygen Therapy: Patient Spontanous Breathing  Post-op Assessment: Report given to RN and Post -op Vital signs reviewed and stable  Post vital signs: Reviewed and stable--91/59, ST at 118, spo2 98% on RA  Last Vitals:  Vitals:   06/25/16 1229 06/25/16 1231  BP: (!) 141/69 136/72  Pulse: 83 (!) 102  Resp: 18   Temp:      Last Pain:  Vitals:   06/25/16 1200  TempSrc:   PainSc: Asleep         Complications: No apparent anesthesia complications

## 2016-06-25 NOTE — H&P (Signed)
Melinda [name] is a 41 y.o. female G 2 P1001 @ 40 4/7 weeks presenting for labor. Contractions started overnight.  Blood show this am.  Contractions q 2 min. Deep variable decels noted in MAU on arrival.  By the time pt arrived to L&D, ROM with thick meconium. Prenatal care with [facility] Simona Huh) has been good.  Complicated by AMA and Gestational Thrombocytopenia within the last month. Pt with a h/o C-section due to fetal distress.  Pt desire TOLAC.Marland Kitchen  Pt denies heavy bleeding and only has pain with contractions.  OB History    Gravida Para Term Preterm AB Living   2 1 1  0 0 1   SAB TAB Ectopic Multiple Live Births   0 0 0 0 1     Past Medical History:  Diagnosis Date  . Fibroid   . H/O varicella   . Herpes   . Seizures ([place])    less than 76 years old   Past Surgical History:  Procedure Laterality Date  . CESAREAN SECTION  09/10/2011   Procedure: CESAREAN SECTION;  Surgeon: Jolayne Haines, MD;  Location: [facility];  Service: Gynecology;  Laterality: N/A;   Family History: family history includes Cancer in her maternal grandfather; Diabetes in her maternal grandfather; Goiter in her maternal grandmother; Hypertension in her maternal grandmother; Stroke in her maternal grandfather. Social History:  reports that she has never smoked. She has never used smokeless tobacco. She reports that she does not drink alcohol or use drugs.     Maternal Diabetes: No Genetic Screening: Normal Maternal Ultrasounds/Referrals: Normal Fetal Ultrasounds or other Referrals:  None Maternal Substance Abuse:  No Significant Maternal Medications:  None Significant Maternal Lab Results:  Lab values include: Group B Strep negative Other Comments:  Gestational Thrombocytopenia  Review of Systems  Gastrointestinal: Positive for abdominal pain.   Maternal Medical History:  Reason for admission: Contractions.   Contractions: Onset was 6-12 hours ago.   Perceived severity is strong.    Prenatal  complications: Thrombocytopenia.   Prenatal Complications - Diabetes: none.    Dilation: 5.5 Effacement (%): 80 Station: -1 Exam by:: Dr. Simona Huh Blood pressure 134/71, pulse 100, resp. rate 18, last menstrual period 09/15/2015, SpO2 99 %. Maternal Exam:  Uterine Assessment: Contraction frequency is regular.   Abdomen: Patient reports no abdominal tenderness. Estimated fetal weight is 7 lbs.   Fetal presentation: vertex  Introitus: Normal vulva. Amniotic fluid character: meconium stained.  Pelvis: adequate for delivery.   Cervix: Cervix evaluated by digital exam.     Fetal Exam Fetal Monitor Review: Baseline rate: 140s.  Variability: moderate (6-25 bpm).   Pattern: variable decelerations.    Fetal State Assessment: Cat I on arrival then Category III.  Resolved once in L&D.  Physical Exam  Constitutional: She is oriented to person, place, and time. She appears well-developed and well-nourished. She appears distressed.  HENT:  Head: Normocephalic and atraumatic.  Eyes: EOM are normal.  Neck: Normal range of motion.  Respiratory: No respiratory distress.  GI: There is no tenderness.  Musculoskeletal: She exhibits edema.  Neurological: She is alert and oriented to person, place, and time.  Skin: Skin is warm and dry.  Psychiatric: She has a normal mood and affect.    IUPC placed to assess uterine contractions and prn amnioinfusion.  GBS:   Neg.  Assessment/Plan: IUP @ 40 4/7 weeks H/o Previous Cesarean section.   --Desires trial of labor.  Risks of TOLAC reviewed.  Consent obtained.  Cat II tracing. --Amnioinfusion prn variable decelerations. --Position change. -- IV fluid bolus --Pt counseled on interventions and possible repeat cesection for fetal distress.  Gestational thrombocytopenia.   --Pt previously counseled she may not be eligible for an epidural if they are too low.  IV Stadol or Nitrous oxide in lieu of epidural.   AMA. Thurnell Lose 06/25/2016,  10:26 AM

## 2016-06-25 NOTE — Lactation Note (Signed)
This note was copied from a baby's chart. Lactation Consultation Note  Patient Name: Melinda [name] RCVEL'F Date: 06/25/2016 Reason for consult: Initial assessment  Visited with P2 Mom in PACU following C/S for NRFHR, meconium stained fluid.  Baby [redacted]w[redacted]d weighing 7 lbs 6.7 oz.  Baby 1 hr old, STS on Mom's chest.  Mom lying flat, as her BP is low.  Baby prone on Mom's chest.  Assisted with latching onto areola following hand expression demonstration.  Colostrum easily expressed.  Baby latched easily.  Demonstrated how to sandwich breast up to facilitate a deeper areolar grasp. Basic breastfeeding reviewed.  Baby to be kept STS as much as possible, feeding often on cue.  Talked about IP and OP Lactation services available to her.  Encouraged Mom to call for assistance as needed. Lactation to follow up in am, and prn.  Consult Status Consult Status: Follow-up Date: 06/26/16 Follow-up type: In-patient    Broadus John 06/25/2016, 3:18 PM

## 2016-06-25 NOTE — Addendum Note (Signed)
Addendum  created 06/25/16 2133 by Alvy Bimler, CRNA   Sign clinical note

## 2016-06-25 NOTE — Progress Notes (Signed)
Fetal tracing with repetitive deep variables.  Contractions qu 2 min. Recommend proceeding with repeat cesarean section.  Risks reviewed.  Consent obtainted. Terbutaline administered.

## 2016-06-25 NOTE — Anesthesia Postprocedure Evaluation (Signed)
Anesthesia Post Note  Patient: Melinda [name]  Procedure(s) Performed: Procedure(s) (LRB): CESAREAN SECTION (N/A)  Patient location during evaluation: PACU Anesthesia Type: Epidural Level of consciousness: awake and alert Pain management: pain level controlled Vital Signs Assessment: post-procedure vital signs reviewed and stable Respiratory status: spontaneous breathing Cardiovascular status: stable Postop Assessment: epidural receding Anesthetic complications: no        Last Vitals:  Vitals:   06/25/16 1530 06/25/16 1545  BP:  115/64  Pulse: (!) 105 93  Resp: 19 18  Temp:  36.7 C    Last Pain:  Vitals:   06/25/16 1545  TempSrc: Oral  PainSc: 0-No pain   Pain Goal:                 Nolon Nations

## 2016-06-25 NOTE — MAU Provider Note (Signed)
NP called to the room, patient's husband came out of room 10 concerned about fetal tracing. NP at bedside, fetal tracing reviewed. Tachysystole, deep variables down to the 90;s from 9:36-9:44, recovered with good variability. Dr. Simona Huh called and notified. Dr. Simona Huh on her way to the hospital to see the patient.  Lezlie Lye, NP 06/25/2016 10:02 AM

## 2016-06-25 NOTE — Anesthesia Preprocedure Evaluation (Signed)
Anesthesia Evaluation  Patient identified by MRN, date of birth, ID band Patient awake    Reviewed: Allergy & Precautions, H&P , NPO status , Patient's Chart, lab work & pertinent test results  Airway Mallampati: II  TM Distance: >3 FB Neck ROM: full    Dental  (+) Teeth Intact   Pulmonary neg pulmonary ROS,    breath sounds clear to auscultation       Cardiovascular negative cardio ROS   Rhythm:regular Rate:Normal     Neuro/Psych Seizures -,  negative psych ROS   GI/Hepatic negative GI ROS, Neg liver ROS,   Endo/Other  negative endocrine ROS  Renal/GU negative Renal ROS     Musculoskeletal negative musculoskeletal ROS (+)   Abdominal   Peds  Hematology negative hematology ROS (+)   Anesthesia Other Findings       Reproductive/Obstetrics (+) Pregnancy                             Anesthesia Physical  Anesthesia Plan  ASA: II  Anesthesia Plan: Epidural   Post-op Pain Management:    Induction:   Airway Management Planned:   Additional Equipment:   Intra-op Plan:   Post-operative Plan:   Informed Consent: I have reviewed the patients History and Physical, chart, labs and discussed the procedure including the risks, benefits and alternatives for the proposed anesthesia with the patient or authorized representative who has indicated his/her understanding and acceptance.     Plan Discussed with:   Anesthesia Plan Comments:         Anesthesia Quick Evaluation

## 2016-06-25 NOTE — H&P (Signed)
Called to bedside by RN abnormal fetal tracing s/p epidural placement. Amnioinfusion bolus had been started, IVF bolus finishing up.  Position changes did not help.  Upon arrival to bedside, pt with O2 via face mask.   Tracing with variable decelerations, moderate.  Good variability throughout. Contraction q 79min.  MVUs 180 and good fetal scalp stimulation per RN  BP 120s/80s when called.  Cervix unchanged.  No cord palpated.  Tracing improved while in room. Await benefit of amnio infusion.  Continue O2.   Proceed with c-section if no improvement.

## 2016-06-25 NOTE — Brief Op Note (Signed)
06/25/2016  2:32 PM  PATIENT:  Melinda [name]  41 y.o. female  PRE-OPERATIVE DIAGNOSIS: IUP @ 40 4/7 weeks, Fetal distress, Failed VBAC  POST-OPERATIVE DIAGNOSIS:  Same  PROCEDURE:  Procedure(s): CESAREAN SECTION (N/A), Repeat LTCS, 2 layer closure  SURGEON:  Surgeon(s) and Role:    * Thurnell Lose, MD - Primary  PHYSICIAN ASSISTANT:   ASSISTANTS: Dr. Christophe Louis, Technician   ANESTHESIA:   epidural  EBL:  Total I/O In: 1700 [I.V.:3500; IV Piggyback:250] Out: 1749 [Urine:50; Blood:1300]  BLOOD ADMINISTERED:none  DRAINS: Urinary Catheter (Foley)   LOCAL MEDICATIONS USED:  NONE  SPECIMEN:  Source of Specimen:  Placenta  DISPOSITION OF SPECIMEN:  PATHOLOGY  COUNTS:  YES  TOURNIQUET:  * No tourniquets in log *  DICTATION: .Other Dictation: Dictation Number [id number]  PLAN OF CARE: Admit to inpatient   PATIENT DISPOSITION:  PACU - hemodynamically stable.   Delay start of Pharmacological VTE agent (>24hrs) due to surgical blood loss or risk of bleeding: yes

## 2016-06-25 NOTE — MAU Note (Signed)
Patient presents to mau with c/o contractions every 1-2 minutes. Reports bright red mucous like discharge. Denies LOF. +FM. Last Thursday was 1cm in the office.

## 2016-06-26 LAB — CBC
HCT: 25.8 % — ABNORMAL LOW (ref 36.0–46.0)
Hemoglobin: 8.8 g/dL — ABNORMAL LOW (ref 12.0–15.0)
MCH: 31.4 pg (ref 26.0–34.0)
MCHC: 34.1 g/dL (ref 30.0–36.0)
MCV: 92.1 fL (ref 78.0–100.0)
Platelets: 98 10*3/uL — ABNORMAL LOW (ref 150–400)
RBC: 2.8 MIL/uL — ABNORMAL LOW (ref 3.87–5.11)
RDW: 14.8 % (ref 11.5–15.5)
WBC: 13.9 10*3/uL — ABNORMAL HIGH (ref 4.0–10.5)

## 2016-06-26 LAB — RPR: RPR Ser Ql: NONREACTIVE

## 2016-06-26 MED ORDER — LACTATED RINGERS IV BOLUS (SEPSIS)
500.0000 mL | Freq: Once | INTRAVENOUS | Status: AC
  Administered 2016-06-26: 1000 mL via INTRAVENOUS

## 2016-06-26 NOTE — Addendum Note (Signed)
Addendum  created 06/26/16 1642 by Hewitt Blade, CRNA   Sign clinical note

## 2016-06-26 NOTE — Progress Notes (Signed)
Called Dr Nelda Marseille and asked about giving ibuprofen with platelets of 98, she stated yes give ibuprofen.

## 2016-06-26 NOTE — Progress Notes (Signed)
Postoperative Note Day # 1  S:  Pt had a long night as she was nauseous and vomiting until about midnight.  Since then she has been able to keep clears down this am without difficulty.  Pain controlled.  No flatus, no BM.  Lochia appropriate.  Foley still in place. No fever or chill, SOB or CP.  Pt plans on breastfeeding.  Denies headache or dizziness.  Pt up to stand, not yet ambulating.  O: Temp:  [97.4 F (36.3 C)-98.7 F (37.1 C)] 98.7 F (37.1 C) (05/03 0605) Pulse Rate:  [80-182] 86 (05/03 0605) Resp:  [17-23] 18 (05/03 0605) BP: (91-164)/(49-119) 107/53 (05/03 0605) SpO2:  [92 %-100 %] 92 % (05/03 0355) Weight:  [212 lb (96.2 kg)] 212 lb (96.2 kg) (05/02 1020)  UOP: 325/8hr  Gen: A&Ox3, NAD CV: RRR, no MRG Resp: CTAB Abdomen: soft, NT, ND BS quiet Uterus: firm, non-tender, below umbilicus Incision: c/d/i, bandage on Ext: 2+ pitting pedal edema, no calf tenderness bilaterally, SCDs in place  Labs:  CBC Latest Ref Rng & Units 06/26/2016 06/25/2016 06/25/2016  WBC 4.0 - 10.5 K/uL 13.9(H) 14.3(H) 9.6  Hemoglobin 12.0 - 15.0 g/dL 8.8(L) 9.0(L) 12.6  Hematocrit 36.0 - 46.0 % 25.8(L) 26.8(L) 37.0  Platelets 150 - 400 K/uL 98(L) 93(L) 125(L)     A/P: Pt is a 41 y.o. [id number] s/p repeat C-section, POD #1  -Heme: stable following EBL: 1300, pt currently on iron therapy.  VSS and pt asymptomatic.  Low UOP likely due to emesis- fluid bolus being given.  Foley to remain in place to ensure adequate UOP- once pt tolerating po will discontinue -GI: Advance diet as tolerated, zofran prn for nausea -Activity: encouraged sitting up to chair and ambulation as tolerated -Prophylaxis: SCDs  DISPO: Continue with routine postoperative care.   Janyth Pupa, DO [phone] (pager) [phone] (office)

## 2016-06-26 NOTE — Anesthesia Postprocedure Evaluation (Signed)
Anesthesia Post Note  Patient: Melinda [name]  Procedure(s) Performed: Procedure(s) (LRB): CESAREAN SECTION (N/A)  Patient location during evaluation: Mother Baby Anesthesia Type: Epidural Level of consciousness: awake and alert and oriented Pain management: pain level controlled Vital Signs Assessment: post-procedure vital signs reviewed and stable Respiratory status: spontaneous breathing and nonlabored ventilation Cardiovascular status: stable Postop Assessment: no headache, patient able to bend at knees, no backache, no signs of nausea or vomiting, epidural receding and adequate PO intake Anesthetic complications: no        Last Vitals:  Vitals:   06/26/16 0810 06/26/16 1200  BP: (!) 95/48 102/67  Pulse: 83 97  Resp: 18 18  Temp: 36.6 C 37.4 C    Last Pain:  Vitals:   06/26/16 1200  TempSrc: Oral  PainSc: 3    Pain Goal:                 [employer]

## 2016-06-26 NOTE — Op Note (Signed)
[name], [name]                 ACCOUNT NO.:  [id number]  MEDICAL RECORD NO.:  [id number]  LOCATION:  8250                          FACILITY:  [facility]  PHYSICIAN:  Jola Schmidt, MD   DATE OF BIRTH:  02/23/1976  DATE OF PROCEDURE:  06/25/2016 DATE OF DISCHARGE:                              OPERATIVE REPORT   PREOPERATIVE DIAGNOSIS:  Intrauterine pregnancy at 40-4/7th weeks, fetal distress, failed VBAC.  POSTOPERATIVE DIAGNOSIS:  Intrauterine pregnancy at 40-4/7th weeks, fetal distress, failed VBAC.  PROCEDURE:  Repeat low transverse cesarean section two-layer closure.  SURGEON:  Thurnell Lose, MD.  ASSISTANT:  Christophe Louis, MD, and technician.  ANESTHESIA:  Epidural.  ESTIMATED BLOOD LOSS:  1300.  URINE:  50 mL  BLOOD ADMINISTERED:  None.  DRAINS:  Foley catheter.  LOCAL:  None.  SPECIMEN:  Placenta to pathology.  COUNTS CORRECT:  Yes.  PATIENT DISPOSITION:  PACU hemodynamically stable.  COMPLICATIONS:  None.  FINDINGS:  Viable female in the vertex position facing clinic presentation.  No nuchal cord.  Moderate meconium-stained fluid.  Normal uterus.  Bruising of the lower uterine segment/bladder.  No other signs of rupture.  INDICATIONS FOR PROCEDURE:  Melinda [name], gravida 2, para 1- 0-0-1 with a history of a previous C-section with fetal distress at 5 cm.  She desired to VBAC.  She came in at 4 cm and baby had good variability, but was having moderate variable decelerations.  She did have a run of prolonged decelerations in maternity admission unit and was quickly transferred to Labor and Delivery.  Upon arrival, the patient ruptured with thick meconium.  IUPC was placed.  After the baby had the epidural, the variable decelerations resolved, but they still continued, especially after the epidural IV fluid bolus.  Amnioinfusion and position changes were initiated but were ineffective.  The patient had a prolonged deceleration.  At that time,  C-section was called. Terbutaline was given prior to transfer to the operating room.  The patient was taken to the operating room.  Epidural anesthesia was optimized.  She was prepped and draped in normal sterile fashion.  Time- out was performed.  SCDs were on her legs when operating.  Ancef 2 g IV was infusing.  The abdomen was marked with a marking pen.  Incision was made with a scalpel and carried down to the underlying layer of the fascia with the Bovie.  The fascia was incised at the midline and the incision extended laterally.  The fascia was quite thick and nails were ineffective at cutting the fascia.  Rectus muscles were dissected off the fascia with the curved Mayo scissors.  The rectus muscles were separated in the midline with a Kocher clamp and then a scalpel.  The peritoneum was then identified and entered sharply after being tented with a hemostat.  The peritoneum was stretched and was cut with the Bovie as needed to make adequate room.  The bladder reflection was little high.  We took down the bladder with the serosa and with the Metzenbaum and the Russians.  The bladder was then pushed down.  Bladder blade was held in place as a transverse incision was  made.  It was extended manually.  Bladder blade was removed when delivering baby.  Head was brought to the incision and delivered atraumatically.  Nose and mouth were suctioned.  Shoulders delivered easily.  Cord was clamped x2, cut, and handed off to the awaiting NICU team.  The patient had a lot of bleeding from large sinuses at the hysterotomy incision.  We grasped and cleared all that with the ring forceps.  We eventually closed the incision with 0 Vicryl in a continuous locked fashion and 2nd layer for imbrication there were also a few stitches that were used for isolating bleeding.  The uterus was hemostatic, Pitocin was going and it was nice and firm.  The right ovary and tube were visualized.  Left ovary could  not be visualized, but palpated.  We could see the tube, but could not see the ovary, it was palpated to be normal.  Copious irrigation was performed of the abdomen.  The fascia was then closed with 0 Vicryl.  The subcutaneous space was closed with 2-0 plain gut.  The skin was then reapproximated with 4-0 Monocryl and Dermabond was applied.  Pressure dressing would also be applied.  Instrument, sponge, and needle counts were correct x3.  The patient tolerated the procedure well.  She was taken to the recovery room in stable condition.  Hemoglobin postop was 9.6, down from 12.6.     Jola Schmidt, MD     EBV/MEDQ  D:  06/25/2016  T:  06/26/2016  Job:  [id number]

## 2016-06-27 MED ORDER — OXYCODONE-ACETAMINOPHEN 5-325 MG PO TABS: 1.0000 | ORAL_TABLET | ORAL | 0 refills | Status: DC | PRN

## 2016-06-27 MED ORDER — IBUPROFEN 600 MG PO TABS: 600.0000 mg | ORAL_TABLET | Freq: Four times a day (QID) | ORAL | 1 refills | Status: DC

## 2016-06-27 NOTE — Lactation Note (Signed)
This note was copied from a baby's chart. Lactation Consultation Note  Patient Name: Melinda [name] NATFT'D Date: 06/27/2016 Reason for consult: Follow-up assessment Baby at 48 hr of life with a 7% wt loss and no stool in the last 24 hr. Mom is reporting bilateral nipple pain. Both nipples appear compressed with light red ovals on the middle of the nipple surfaces. Given comfort gels. Demonstrated manual expression, easily expressed transitional milk noted bilaterally. Demonstrated spoon feeding. Mom's breast are starting to fill. She has a DEBP at home that she knows how to use. Reviewed breast changes and breast/nipple care. Baby was noted to have a tight jaw and neck. He has a hard time with a wide gape. Demonstrated tummy time and jaw massage. Assisted parents with football and cross cradle positioning to achieve a deep latch. Parents are aware of lactation services and support group.  Mom will offer the breast on demand 8+/24hr, post express, and offer expressed milk per volume guidelines until optimal voids and infant wt is achieved.    Maternal Data    Feeding Feeding Type: Breast Fed Length of feed: 20 min  LATCH Score/Interventions Latch: Repeated attempts needed to sustain latch, nipple held in mouth throughout feeding, stimulation needed to elicit sucking reflex. Intervention(s): Adjust position;Assist with latch  Audible Swallowing: Spontaneous and intermittent Intervention(s): Skin to skin;Hand expression  Type of Nipple: Everted at rest and after stimulation  Comfort (Breast/Nipple): Filling, red/small blisters or bruises, mild/mod discomfort  Problem noted: Mild/Moderate discomfort;Cracked, bleeding, blisters, bruises;Filling Interventions  (Cracked/bleeding/bruising/blister): Expressed breast milk to nipple Interventions (Mild/moderate discomfort): Comfort gels  Hold (Positioning): Assistance needed to correctly position infant at breast and maintain  latch. Intervention(s): Position options;Support Pillows  LATCH Score: 7  Lactation Tools Discussed/Used WIC Program: No   Consult Status Consult Status: Follow-up Date: 06/28/16 Follow-up type: [place] 06/27/2016, 1:11 PM

## 2016-06-27 NOTE — Discharge Instructions (Signed)

## 2016-06-27 NOTE — Discharge Summary (Addendum)
OB Discharge Summary     Patient Name: Melinda [name] DOB: [birth year] MRN: [id number]  Date of admission: 06/25/2016 Delivering MD: Thurnell Lose   Date of discharge: 06/28/2016 (Discharge cancelled on 5/42018 b/c baby could not be discharged due to weight)  Admitting diagnosis: 37WKS,CTX,BRIGHT RED MUCUS DISCHARGE Intrauterine pregnancy: [redacted]w[redacted]d     Secondary diagnosis:  Active Problems:   Normal labor   Status post repeat low transverse cesarean section  Additional problems: H/o previous cesarean section     Discharge diagnosis: Term Pregnancy Delivered                                                                                                Post partum procedures:None  Augmentation: None  Complications: [id number]  Hospital course:  Onset of Labor With Unplanned C/S  41 y.o. yo G2P2002 at [redacted]w[redacted]d was admitted in Active Labor on 06/25/2016. Patient had a labor course significant for Fetal distress. Membrane Rupture Time/Date: 9:40 AM ,06/25/2016   The patient went for cesarean section due to Non-Reassuring FHR, and delivered a Viable infant,06/25/2016  Details of operation can be found in separate operative note. Patient had an uncomplicated postpartum course.  She is ambulating,tolerating a regular diet, passing flatus, and urinating well.  Patient is discharged home in stable condition 06/27/16.  Physical exam  Vitals:   06/26/16 0810 06/26/16 1200 06/26/16 1900 06/27/16 0525  BP: (!) 95/48 102/67 127/87 113/63  Pulse: 83 97 98 (!) 103  Resp: 18 18 18 17   Temp: 97.8 F (36.6 C) 99.4 F (37.4 C) 98.7 F (37.1 C) 98.8 F (37.1 C)  TempSrc: Axillary Oral Oral Oral  SpO2: 98% 99% 98%   Weight:      Height:       AFVSS Light headed upon standing but went away quickly. Bleeding slowing. General: alert, cooperative and no distress Lochia: appropriate Uterine Fundus: firm Incision: Dressing is clean, dry, and intact DVT Evaluation: No evidence of DVT seen on  physical exam. Calf/Ankle edema is present Labs: Lab Results  Component Value Date   WBC 13.9 (H) 06/26/2016   HGB 8.8 (L) 06/26/2016   HCT 25.8 (L) 06/26/2016   MCV 92.1 06/26/2016   PLT 98 (L) 06/26/2016   CMP Latest Ref Rng & Units 09/11/2011  Glucose 70 - 99 mg/dL 80  BUN 6 - 23 mg/dL 10  Creatinine 0.50 - 1.10 mg/dL 1.15(H)  Sodium 135 - 145 mEq/L 132(L)  Potassium 3.5 - 5.1 mEq/L 4.5  Chloride 96 - 112 mEq/L 100  CO2 19 - 32 mEq/L 26  Calcium 8.4 - 10.5 mg/dL 8.3(L)  Total Protein 6.0 - 8.3 g/dL 5.1(L)  Total Bilirubin 0.3 - 1.2 mg/dL 0.3  Alkaline Phos 39 - 117 U/L 105  AST 0 - 37 U/L 25  ALT 0 - 35 U/L 13    Discharge instruction: per After Visit Summary and "Baby and Me Booklet".  After visit meds:  Allergies as of 06/27/2016      Reactions   Latex Rash      Medication List    STOP taking these  medications   valACYclovir 500 MG tablet Commonly known as:  VALTREX     TAKE these medications   ibuprofen 600 MG tablet Commonly known as:  ADVIL,MOTRIN Take 1 tablet (600 mg total) by mouth every 6 (six) hours.   oxyCODONE-acetaminophen 5-325 MG tablet Commonly known as:  PERCOCET/ROXICET Take 1 tablet by mouth every 4 (four) hours as needed (pain scale 4-7).   oxyCODONE-acetaminophen 5-325 MG tablet Commonly known as:  PERCOCET/ROXICET Take 1-2 tablets by mouth every 4 (four) hours as needed (pain scale > 7).   prenatal multivitamin Tabs tablet Take 1 tablet by mouth daily at 12 noon.       Diet: routine diet  Activity: Advance as tolerated. Pelvic rest for 6 weeks.   Outpatient follow up:2 weeks Follow up Appt:No future appointments. Follow up Visit:No Follow-up on file.  Postpartum contraception: Undecided  Newborn Data: Live born female  Birth Weight: 7 lb 6.7 oz (3365 g) APGAR: 8, 9  Baby Feeding: Breast Disposition:home with mother  Circumcision done day before discharge.   06/27/2016 Thurnell Lose, MD

## 2016-06-27 NOTE — Progress Notes (Signed)
Subjective: Postop Day 2: Cesarean Delivery No complaints.  Pain controlled.  Lochia normal.  Breast feeding yes. Pt requests to cancel discharge.  Baby will need to stay due to weight loss.  Objective: Temp:  [98.4 F (36.9 C)-98.8 F (37.1 C)] 98.4 F (36.9 C) (05/04 1837) Pulse Rate:  [101-103] 101 (05/04 1837) Resp:  [17-18] 18 (05/04 1837) BP: (113-122)/(63-77) 122/77 (05/04 1837)  Physical Exam: Gen: NAD Lochia: Not visualized Uterine Fundus: firm, appropriately tender Incision: Dressing soiled x 50%.  Honeycomb lifted.  No active bleeding. DVT Evaluation: + Edema present, no calf tenderness bilaterally    Recent Labs  06/25/16 1403 06/26/16 0544  HGB 9.0* 8.8*  HCT 26.8* 25.8*    Assessment/Plan: Status post C-section-doing well postoperatively. Hemorrhage.  No s/sxs of anemia.  Continue iron. Change Honeycomb dressing. Encouraged ambulation. Continue current management. Counseled on circumcision.  Perform today. Anticipate discharge tomorrow.   Thurnell Lose 06/27/2016, 8:57 PM

## 2016-06-28 NOTE — Lactation Note (Signed)
This note was copied from a baby's chart. Lactation Consultation Note  Baby has been cluster feeding and mom's breasts are full. Assisted her with massage, latching and breast compression. Mom reports breasts feel better. Many swallows were observed. Informed of support groups and outpatient services. Patient Name: Melinda [name] [id number] Date: 06/28/2016 Reason for consult: Follow-up assessment   Maternal Data    Feeding Feeding Type: Breast Fed Length of feed: 20 min  LATCH Score/Interventions Latch: Grasps breast easily, tongue down, lips flanged, rhythmical sucking.  Audible Swallowing: Spontaneous and intermittent  Type of Nipple: Everted at rest and after stimulation  Comfort (Breast/Nipple): Filling, red/small blisters or bruises, mild/mod discomfort  Problem noted: Mild/Moderate discomfort;Filling Interventions (Filling): Hand pump;Frequent nursing Interventions  (Cracked/bleeding/bruising/blister): Expressed breast milk to nipple  Hold (Positioning): No assistance needed to correctly position infant at breast.  LATCH Score: 9  Lactation Tools Discussed/Used     Consult Status Consult Status: Complete    [name] 06/28/2016, 12:33 PM

## 2016-06-29 LAB — TYPE AND SCREEN
ABO/RH(D): A POS
Antibody Screen: NEGATIVE
Unit division: 0
Unit division: 0

## 2016-06-29 LAB — BPAM RBC
Blood Product Expiration Date: 201805212359
Blood Product Expiration Date: 201805212359
Unit Type and Rh: 6200
Unit Type and Rh: 6200

## 2016-08-04 DIAGNOSIS — D696 Thrombocytopenia, unspecified: Secondary | ICD-10-CM | POA: Diagnosis not present

## 2016-09-17 DIAGNOSIS — B078 Other viral warts: Secondary | ICD-10-CM | POA: Diagnosis not present

## 2016-09-17 DIAGNOSIS — D225 Melanocytic nevi of trunk: Secondary | ICD-10-CM | POA: Diagnosis not present

## 2016-09-17 DIAGNOSIS — L821 Other seborrheic keratosis: Secondary | ICD-10-CM | POA: Diagnosis not present

## 2016-10-08 DIAGNOSIS — R300 Dysuria: Secondary | ICD-10-CM | POA: Diagnosis not present

## 2016-11-12 DIAGNOSIS — D696 Thrombocytopenia, unspecified: Secondary | ICD-10-CM | POA: Diagnosis not present

## 2016-12-12 ENCOUNTER — Encounter: Payer: Self-pay | Admitting: Medical

## 2016-12-12 ENCOUNTER — Ambulatory Visit (INDEPENDENT_AMBULATORY_CARE_PROVIDER_SITE_OTHER): Payer: BLUE CROSS/BLUE SHIELD | Admitting: Medical

## 2016-12-12 VITALS — BP 110/68 | HR 87 | Wt 202.8 lb

## 2016-12-12 DIAGNOSIS — B356 Tinea cruris: Secondary | ICD-10-CM | POA: Diagnosis not present

## 2016-12-12 DIAGNOSIS — B353 Tinea pedis: Secondary | ICD-10-CM

## 2016-12-12 MED ORDER — TERBINAFINE HCL 1 % EX CREA: 1.0000 "application " | TOPICAL_CREAM | Freq: Two times a day (BID) | CUTANEOUS | 0 refills | Status: DC

## 2016-12-12 NOTE — Progress Notes (Signed)
Subjective: Chief Complaint  Patient presents with  . Rash    rash on groin x 1 week like bump and discolor    Here for bump or rash in groin.  Noticed about a week ago.  Usually sees Vickie  NP here.   She notes darker skin, bump.   Using lemon tea tree and olive oil.    Using the oil topically has helped the itch.   It was itchy prior.  No drainage.   Bump is red after bathing, otherwise not red, no pain.   No prior similar.    No hx/o skin abscess.  Also has cracking open between toes, itching, feels like she has foot fungus.   Past Medical History:  Diagnosis Date  . Fibroid   . H/O varicella   . Herpes   . Seizures ([place])    less than 53 years old    No current outpatient prescriptions on file prior to visit.   No current facility-administered medications on file prior to visit.    ROS as in subjective  Objective BP 110/68   Pulse 87   Wt 202 lb 12.8 oz (92 kg)   SpO2 98%   BMI 32.73 kg/m   Gen: wd, wn, nad, AA female Skin: left intertriginous area of groin with contiguous brownish pink rash with single papule lesion in middle, suggestive of tinea Exam chaperoned by nurse   Assessment: Encounter Diagnoses  Name Primary?  . Tinea cruris Yes  . Tinea pedis of both feet    Plan: Begin medication below for groin rash and tinea of feet daily for 2-3 weeks if needed.  Discussed usual time frame for symptom to resolve.   discussed hygiene.  F/u prn.    Corisa was seen today for rash.  Diagnoses and all orders for this visit:  Tinea cruris  Tinea pedis of both feet  Other orders -     terbinafine (LAMISIL AT) 1 % cream; Apply 1 application topically 2 (two) times daily.

## 2017-06-30 ENCOUNTER — Ambulatory Visit (INDEPENDENT_AMBULATORY_CARE_PROVIDER_SITE_OTHER): Payer: BLUE CROSS/BLUE SHIELD | Admitting: Orthopaedic Surgery

## 2017-06-30 ENCOUNTER — Ambulatory Visit (INDEPENDENT_AMBULATORY_CARE_PROVIDER_SITE_OTHER): Payer: BLUE CROSS/BLUE SHIELD

## 2017-06-30 ENCOUNTER — Encounter (INDEPENDENT_AMBULATORY_CARE_PROVIDER_SITE_OTHER): Payer: Self-pay | Admitting: Orthopaedic Surgery

## 2017-06-30 ENCOUNTER — Ambulatory Visit (INDEPENDENT_AMBULATORY_CARE_PROVIDER_SITE_OTHER): Payer: Self-pay

## 2017-06-30 DIAGNOSIS — M79671 Pain in right foot: Secondary | ICD-10-CM | POA: Insufficient documentation

## 2017-06-30 MED ORDER — IBUPROFEN-FAMOTIDINE 800-26.6 MG PO TABS: ORAL_TABLET | ORAL | 6 refills | Status: DC

## 2017-06-30 NOTE — Progress Notes (Signed)
Office Visit Note   Patient: Melinda [name]           Date of Birth: 03/09/1975           MRN: [id number] Visit Date: 06/30/2017              Requested by: Girtha Rm, NP-C [place], [place] [address] PCP: Girtha Rm, NP-C   Assessment & Plan: Visit Diagnoses:  1. Pain in right foot     Plan: Impression is right foot metatarsalgia.  I have recommended metatarsal pads.  Should she not be able to find these at a local store, she will let us know we will provide her with the ones we have here in our office.  Her insurance however does not cover these.  I have also recommended relative rest and Duexis.  A prescription was called in for this.  She will follow-up with [place] if she is not any better in the next 6 weeks.  Call with concerns or questions in the meantime.  Follow-Up Instructions: Return if symptoms worsen or fail to improve.   Orders:  Orders Placed This Encounter  Procedures  . XR Foot Complete Right   No orders of the defined types were placed in this encounter.     Procedures: No procedures performed   Clinical Data: No additional findings.   Subjective: Chief Complaint  Patient presents with  . Right Foot - Pain    HPI patient is a pleasant 42 year old female who presents to our clinic today with right foot pain.  This began this past Saturday when she was walking.  She went to turn and noticed immediate pain to the ball of her foot.  This did get better Sunday but the pain returned yesterday.  Today it does seem to be a little better.  The pain she has is to the ball of her foot around the second and third metatarsals.  Worse with weightbearing.  Walking on the outside of her foot seems to make the pain better.  No radicular symptoms noted.  No previous stress fracture.  Review of Systems as detailed in HPI.  All others reviewed and are negative.   Objective: Vital Signs: There were no vitals taken for this visit.  Physical Exam  well-developed and well-nourished female in no acute distress.  Alert and oriented x3.  Ortho Exam examination of her right foot reveals no swelling.  No point tenderness to the first through fifth metatarsals.  Minimal tenderness between the first and second and second and third metatarsal heads.  Full range of motion.  She is neurovascularly intact distally.  Specialty Comments:  No specialty comments available.  Imaging: No results found.   PMFS History: Patient Active Problem List   Diagnosis Date Noted  . Pain in right foot 06/30/2017  . Normal labor 06/25/2016  . Status post repeat low transverse cesarean section 06/25/2016   Past Medical History:  Diagnosis Date  . Fibroid   . H/O varicella   . Herpes   . Seizures ([place])    less than 49 years old    Family History  Problem Relation Age of Onset  . Goiter Maternal Grandmother   . Hypertension Maternal Grandmother   . Diabetes Maternal Grandfather   . Cancer Maternal Grandfather   . Stroke Maternal Grandfather     Past Surgical History:  Procedure Laterality Date  . CESAREAN SECTION  09/10/2011   Procedure: CESAREAN SECTION;  Surgeon: Pierre Bali  Laurance Flatten, MD;  Location: [facility];  Service: Gynecology;  Laterality: N/A;  . CESAREAN SECTION N/A 06/25/2016   Procedure: CESAREAN SECTION;  Surgeon: Thurnell Lose, MD;  Location: [facility];  Service: Obstetrics;  Laterality: N/A;   Social History   Occupational History  . Not on file  Tobacco Use  . Smoking status: Never Smoker  . Smokeless tobacco: Never Used  Substance and Sexual Activity  . Alcohol use: No    Alcohol/week: 0.0 oz  . Drug use: No  . Sexual activity: Yes

## 2017-07-22 IMAGING — US US OB COMP LESS 14 WK
1 series · 15 of 28 positions shown · non-contrast
Comparison: None.

CLINICAL DATA: 40-year-old pregnant female presents with pelvic
pressure and vaginal bleeding. Quantitative beta HCG 1,235 on
10/18/2015.

EDC by LMP: 06/21/2016, projecting to an expected gestational age of
7 weeks 5 days.
EXAM:
OBSTETRIC <14 WK ULTRASOUND
TECHNIQUE: Transabdominal ultrasound was performed for evaluation of the
gestation as well as the maternal uterus and adnexal regions.
Transvaginal scan was declined by the patient per the ultrasound
technologist.

[Series 1: us ob comp less 14 wk · 15 of 52 slices shown]
[im 1/52]
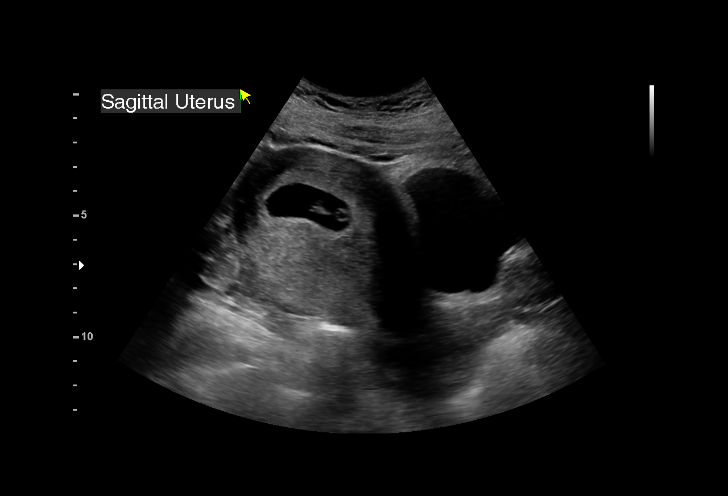
[im 4/52]
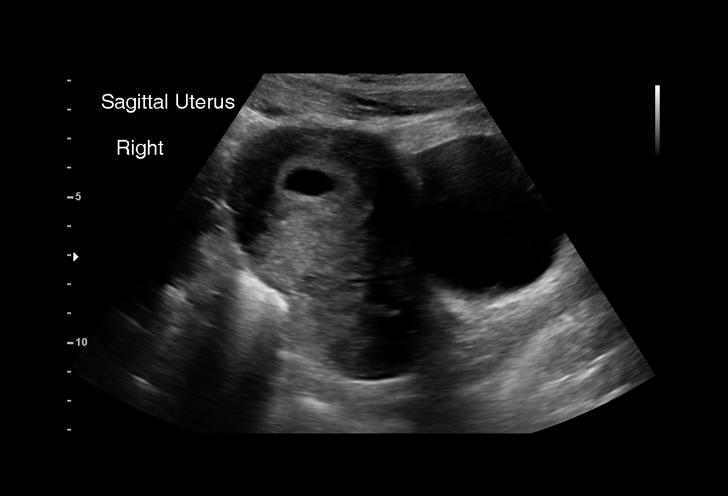
[im 8/52]
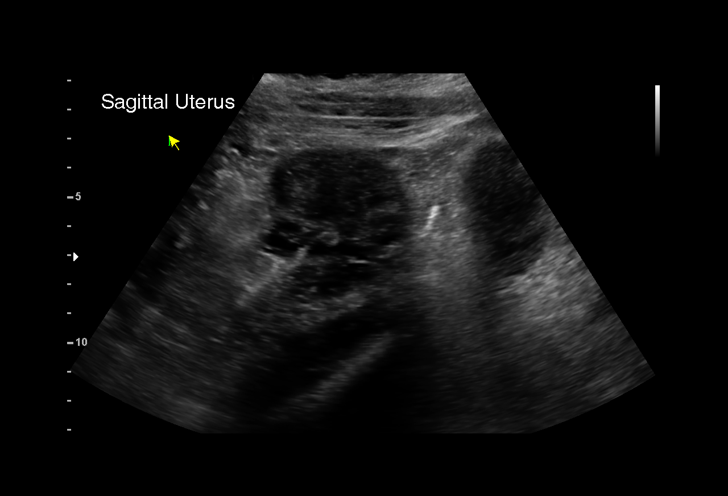
[im 12/52]
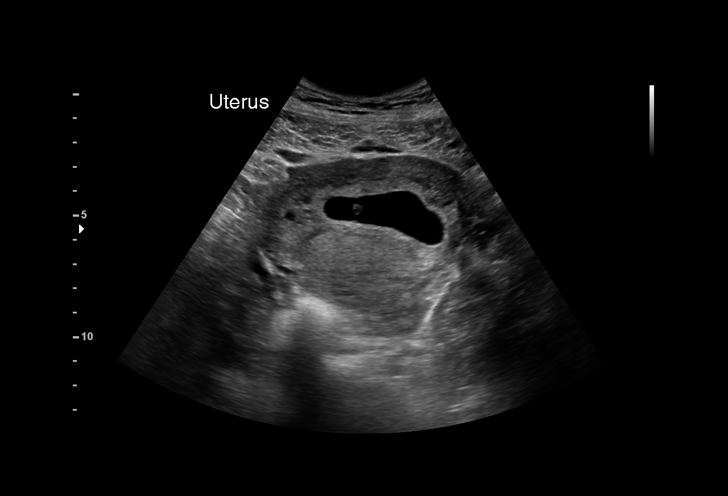
[im 16/52]
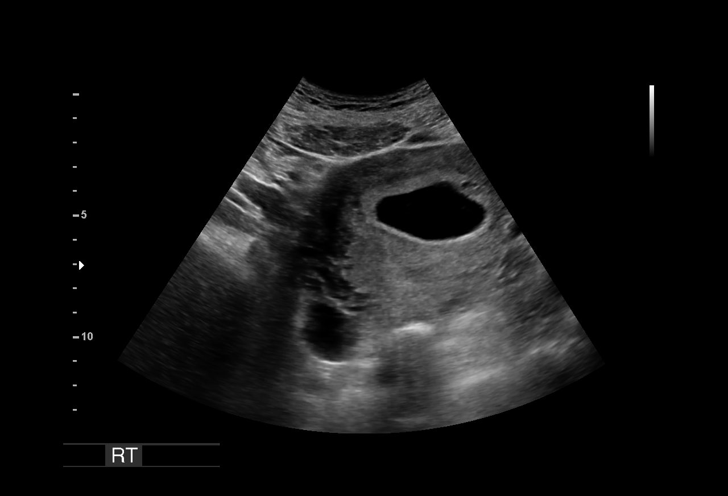
[im 19/52]
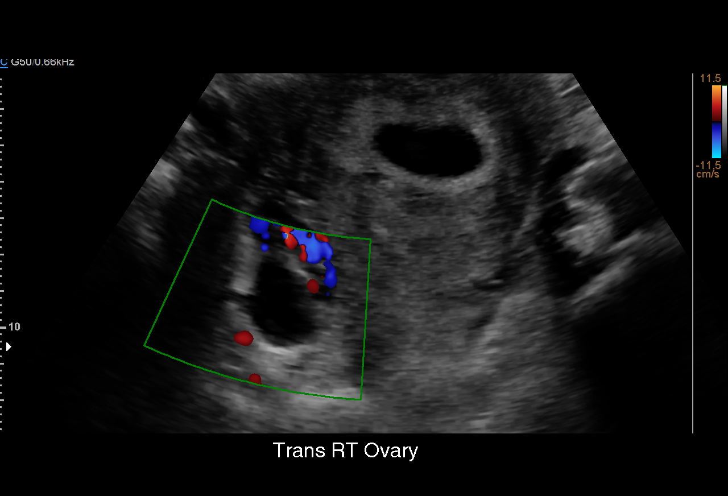
[im 23/52]
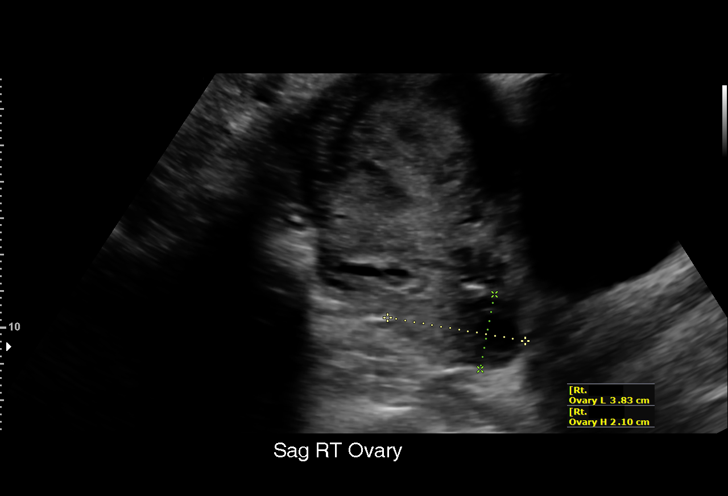
[im 27/52]
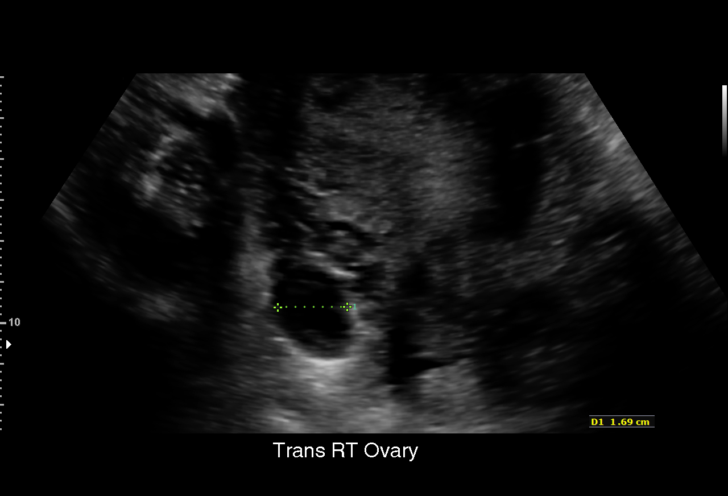
[im 29/52]
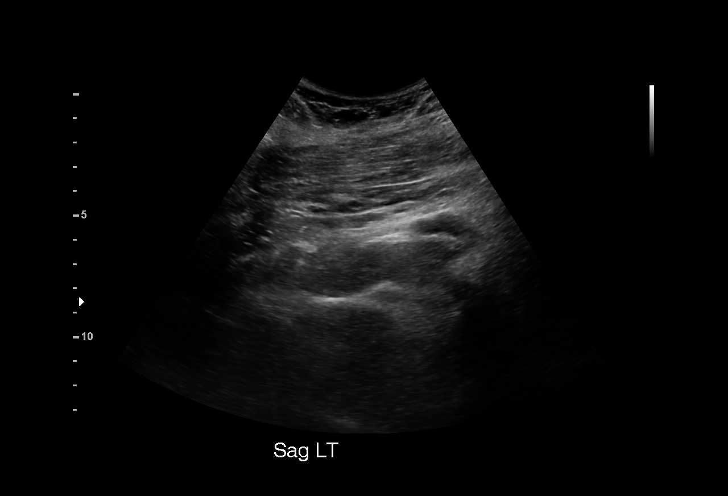
[im 33/52]
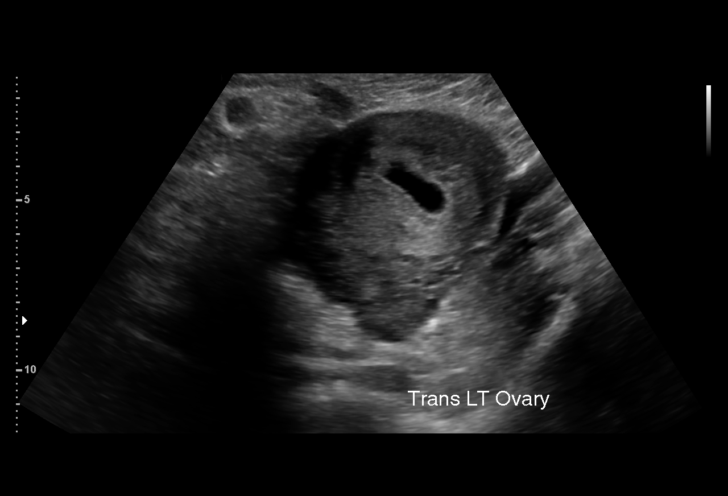
[im 36/52]
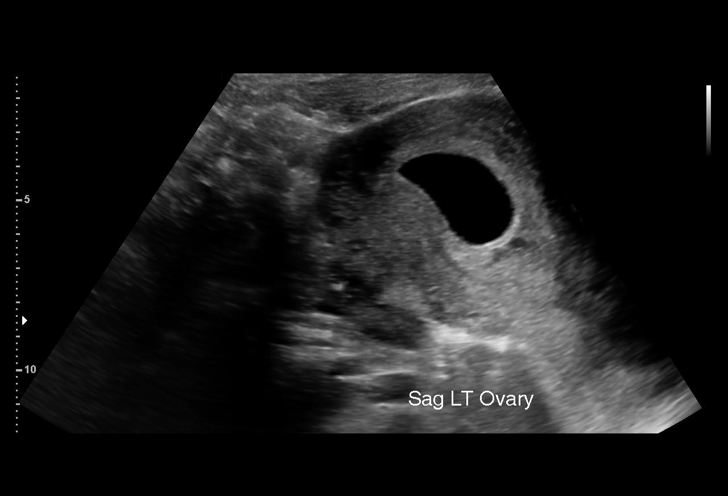
[im 40/52]
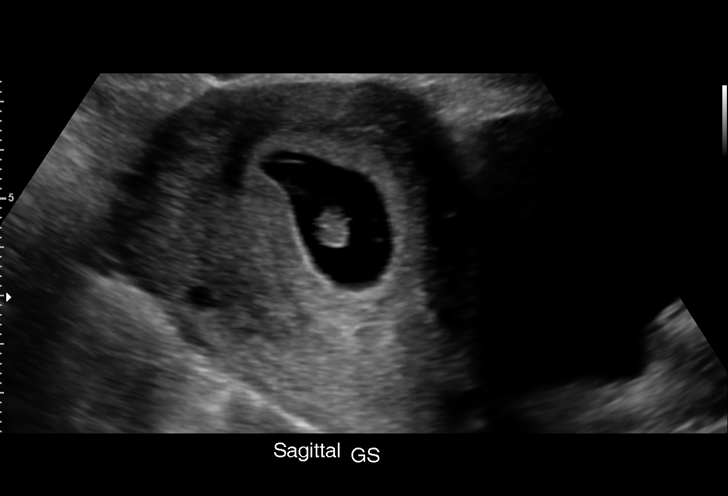
[im 44/52]
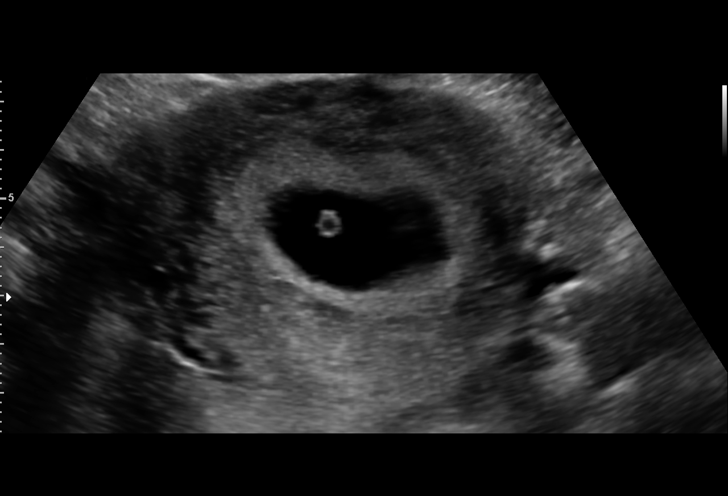
[im 48/52]
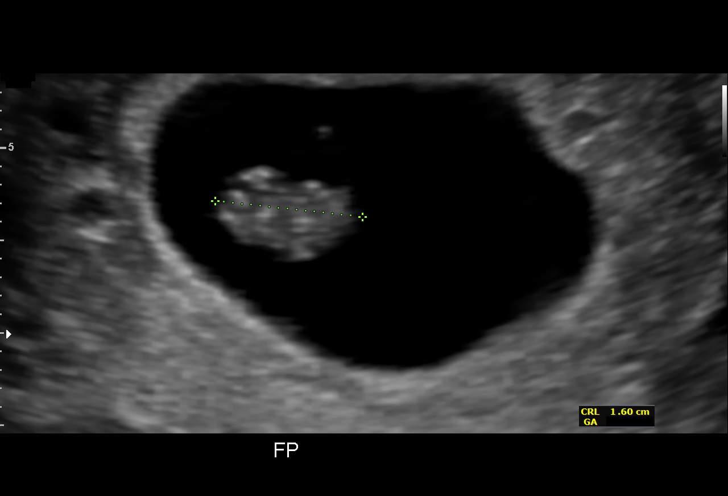
[im 52/52]
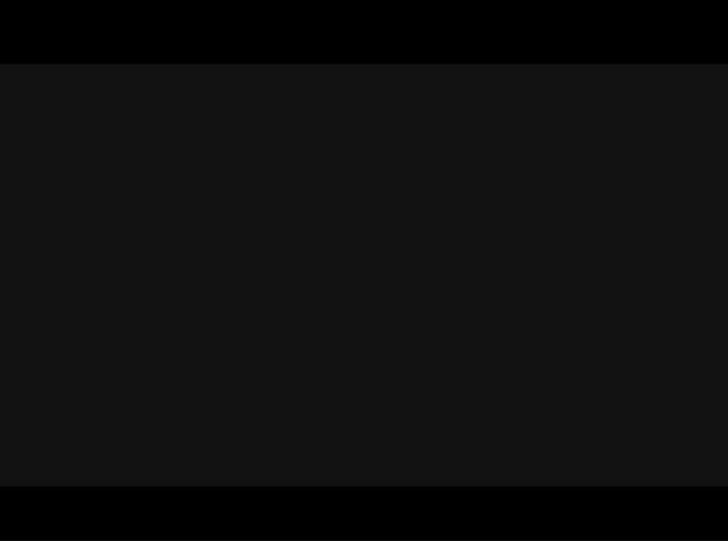

[15 of 28 positions shown; findings below may reference images not displayed]

FINDINGS: Intrauterine gestational sac: Single intrauterine gestational sac
appears normal in size, shape and position.

Yolk sac:  Present.

Embryo:  Present.

Embryonic Cardiac Activity: Regular rate and rhythm.

Embryonic Heart Rate: 177 bpm

CRL:   16.4  mm   8 w 0 d                  US EDC: 06/19/2016

Subchorionic hemorrhage:  None demonstrated.

Maternal uterus/adnexae: Right ovary measures 3.8 x 2.1 x 2.2 cm and
contains a 1.7 cm corpus luteum. Left ovary measures 3.1 x 1.4 x
cm. No suspicious ovarian or adnexal masses. No abnormal free fluid
in the pelvis. No uterine fibroids are demonstrated.
IMPRESSION: 1. Single living intrauterine gestation at 8 weeks 0 days by
crown-rump length, concordant with the expected gestational age of 7
weeks 5 days by provided menstrual dating.
2. No evidence of a first-trimester gestational abnormality on this
transabdominal obstetric scan.

## 2017-07-30 DIAGNOSIS — R430 Anosmia: Secondary | ICD-10-CM | POA: Diagnosis not present

## 2017-09-24 DIAGNOSIS — Z01411 Encounter for gynecological examination (general) (routine) with abnormal findings: Secondary | ICD-10-CM | POA: Diagnosis not present

## 2017-10-02 DIAGNOSIS — Z131 Encounter for screening for diabetes mellitus: Secondary | ICD-10-CM | POA: Diagnosis not present

## 2017-10-02 DIAGNOSIS — Z1329 Encounter for screening for other suspected endocrine disorder: Secondary | ICD-10-CM | POA: Diagnosis not present

## 2017-10-02 DIAGNOSIS — Z13220 Encounter for screening for lipoid disorders: Secondary | ICD-10-CM | POA: Diagnosis not present

## 2018-01-29 ENCOUNTER — Other Ambulatory Visit: Payer: Self-pay | Admitting: Otolaryngology

## 2018-01-29 DIAGNOSIS — R430 Anosmia: Secondary | ICD-10-CM

## 2018-02-07 ENCOUNTER — Ambulatory Visit
Admission: RE | Admit: 2018-02-07 | Discharge: 2018-02-07 | Disposition: A | Discharge status: Home / Self Care | Payer: BLUE CROSS/BLUE SHIELD | Source: Ambulatory Visit | Attending: Otolaryngology | Admitting: Otolaryngology

## 2018-02-07 DIAGNOSIS — R430 Anosmia: Secondary | ICD-10-CM

## 2018-02-07 MED ORDER — GADOBENATE DIMEGLUMINE 529 MG/ML IV SOLN
15.0000 mL | Freq: Once | INTRAVENOUS | Status: AC | PRN
  Administered 2018-02-07: 15 mL via INTRAVENOUS

## 2018-08-26 DIAGNOSIS — N390 Urinary tract infection, site not specified: Secondary | ICD-10-CM | POA: Diagnosis not present

## 2018-09-27 ENCOUNTER — Other Ambulatory Visit: Payer: Self-pay | Admitting: Obstetrics and Gynecology

## 2018-09-27 ENCOUNTER — Other Ambulatory Visit (HOSPITAL_COMMUNITY)
Admission: RE | Admit: 2018-09-27 | Discharge: 2018-09-27 | Disposition: A | Discharge status: Home / Self Care | Payer: BC Managed Care – PPO | Source: Ambulatory Visit | Attending: Obstetrics and Gynecology | Admitting: Obstetrics and Gynecology

## 2018-09-27 DIAGNOSIS — Z01411 Encounter for gynecological examination (general) (routine) with abnormal findings: Secondary | ICD-10-CM | POA: Diagnosis not present

## 2018-09-27 DIAGNOSIS — Z124 Encounter for screening for malignant neoplasm of cervix: Secondary | ICD-10-CM | POA: Diagnosis not present

## 2018-09-28 ENCOUNTER — Other Ambulatory Visit: Payer: Self-pay | Admitting: Obstetrics and Gynecology

## 2018-09-28 DIAGNOSIS — Z1231 Encounter for screening mammogram for malignant neoplasm of breast: Secondary | ICD-10-CM

## 2018-09-29 DIAGNOSIS — E789 Disorder of lipoprotein metabolism, unspecified: Secondary | ICD-10-CM | POA: Diagnosis not present

## 2018-09-29 DIAGNOSIS — E669 Obesity, unspecified: Secondary | ICD-10-CM | POA: Diagnosis not present

## 2018-09-30 LAB — CYTOLOGY - PAP
Diagnosis: NEGATIVE
HPV: NOT DETECTED

## 2018-11-12 ENCOUNTER — Ambulatory Visit
Admission: RE | Admit: 2018-11-12 | Discharge: 2018-11-12 | Disposition: A | Discharge status: Home / Self Care | Payer: BC Managed Care – PPO | Source: Ambulatory Visit | Attending: Obstetrics and Gynecology | Admitting: Obstetrics and Gynecology

## 2018-11-12 ENCOUNTER — Other Ambulatory Visit: Payer: Self-pay

## 2018-11-12 ENCOUNTER — Ambulatory Visit: Payer: No Typology Code available for payment source

## 2018-11-12 DIAGNOSIS — Z1231 Encounter for screening mammogram for malignant neoplasm of breast: Secondary | ICD-10-CM

## 2019-01-05 DIAGNOSIS — R110 Nausea: Secondary | ICD-10-CM | POA: Diagnosis not present

## 2019-01-05 DIAGNOSIS — R55 Syncope and collapse: Secondary | ICD-10-CM | POA: Diagnosis not present

## 2019-01-05 DIAGNOSIS — R000 Tachycardia, unspecified: Secondary | ICD-10-CM | POA: Diagnosis not present

## 2019-01-09 ENCOUNTER — Other Ambulatory Visit: Payer: Self-pay

## 2019-01-09 ENCOUNTER — Encounter (HOSPITAL_COMMUNITY): Payer: Self-pay

## 2019-01-09 ENCOUNTER — Ambulatory Visit (HOSPITAL_COMMUNITY)
Admission: EM | Admit: 2019-01-09 | Discharge: 2019-01-09 | Disposition: A | Discharge status: Home / Self Care | Payer: BC Managed Care – PPO | Attending: Emergency Medicine | Admitting: Emergency Medicine

## 2019-01-09 DIAGNOSIS — R42 Dizziness and giddiness: Secondary | ICD-10-CM | POA: Diagnosis not present

## 2019-01-09 DIAGNOSIS — G43009 Migraine without aura, not intractable, without status migrainosus: Secondary | ICD-10-CM | POA: Diagnosis not present

## 2019-01-09 LAB — BASIC METABOLIC PANEL
Anion gap: 8 (ref 5–15)
BUN: 8 mg/dL (ref 6–20)
CO2: 24 mmol/L (ref 22–32)
Calcium: 9.1 mg/dL (ref 8.9–10.3)
Chloride: 106 mmol/L (ref 98–111)
Creatinine, Ser: 0.89 mg/dL (ref 0.44–1.00)
GFR calc Af Amer: >60 mL/min (ref >60)
GFR calc non Af Amer: >60 mL/min (ref >60)
Glucose, Bld: 92 mg/dL (ref 70–99)
Potassium: 4.1 mmol/L (ref 3.5–5.1)
Sodium: 138 mmol/L (ref 135–145)

## 2019-01-09 LAB — CBC
HCT: 40.3 % (ref 36.0–46.0)
Hemoglobin: 13.3 g/dL (ref 12.0–15.0)
MCH: 30.2 pg (ref 26.0–34.0)
MCHC: 33 g/dL (ref 30.0–36.0)
MCV: 91.4 fL (ref 80.0–100.0)
Platelets: 182 10*3/uL (ref 150–400)
RBC: 4.41 MIL/uL (ref 3.87–5.11)
RDW: 12.2 % (ref 11.5–15.5)
WBC: 5.6 10*3/uL (ref 4.0–10.5)
nRBC: 0 % (ref 0.0–0.2)

## 2019-01-09 MED ORDER — MECLIZINE HCL 12.5 MG PO TABS: 12.5000 mg | ORAL_TABLET | Freq: Three times a day (TID) | ORAL | 0 refills | Status: DC | PRN

## 2019-01-09 MED ORDER — NAPROXEN 500 MG PO TABS: 500.0000 mg | ORAL_TABLET | Freq: Two times a day (BID) | ORAL | 0 refills | Status: DC

## 2019-01-09 NOTE — ED Triage Notes (Signed)
Pt present she fall 3 days ago, and hit her head. Pt is having some headaches. Pt fall because she has been feeling dizziness for some time now.

## 2019-01-09 NOTE — ED Provider Notes (Signed)
[facility]    CSN: [id number] Arrival date & time: 01/09/19  1403      History   Chief Complaint Chief Complaint  Patient presents with  . Dizziness  . Fall  . Migraine    HPI Melinda [name] is a 43 y.o. female history of fibroids, presenting today for evaluation of dizziness and headaches.  Patient states that over the past 1.5 weeks she has had dizziness which comes and goes.  States that she mainly notices after changing position or turning her head quickly.  States that she tends to feel off balance, but denies spinning sensation.  Denies sensation of presyncope.  She denies associated nausea or vomiting.  On Wednesday, 2 days ago she did have an episode where she did feel overheated and did start to feel nauseous and ended up vomiting and passing out.  EMS was called and evaluated her at home.  She has not had nausea or vomiting since.  She has developed a headache today around her right eye.  She believes that when she fell she hit this area of her face on the door.  She denies change in vision or difficulty seeing.  Denies pain or difficulty moving eye.  She denies any difficulty speaking, weakness.  She does not take anything for her headache.  She denies any chest pain or shortness of breath.  Has had some discomfort in her chest that she believes is from vomiting.  This has improved.  Had MRI of brain in 2019 that was negative.  Denies recent URI symptoms. Denies changes in hearing or ringing in ears.  Denies history of diabetes, high blood pressure.  Denies any heart history.  HPI  Past Medical History:  Diagnosis Date  . Fibroid   . H/O varicella   . Herpes   . Seizures ([place])    less than 73 years old    Patient Active Problem List   Diagnosis Date Noted  . Pain in right foot 06/30/2017  . Normal labor 06/25/2016  . Status post repeat low transverse cesarean section 06/25/2016    Past Surgical History:  Procedure Laterality Date  . CESAREAN SECTION   09/10/2011   Procedure: CESAREAN SECTION;  Surgeon: Jolayne Haines, MD;  Location: [facility];  Service: Gynecology;  Laterality: N/A;  . CESAREAN SECTION N/A 06/25/2016   Procedure: CESAREAN SECTION;  Surgeon: Thurnell Lose, MD;  Location: [facility];  Service: Obstetrics;  Laterality: N/A;    OB History    Gravida  2   Para  2   Term  2   Preterm  0   AB  0   Living  2     SAB  0   TAB  0   Ectopic  0   Multiple  0   Live Births  2            Home Medications    Prior to Admission medications   Medication Sig Start Date End Date Taking? Authorizing Provider  Ibuprofen-Famotidine 800-26.6 MG TABS TAKE 1 TAB PO TID PRN 06/30/17   Aundra Dubin, PA-C  meclizine (ANTIVERT) 12.5 MG tablet Take 1 tablet (12.5 mg total) by mouth 3 (three) times daily as needed for dizziness. 01/09/19   Latrece Nitta C, PA-C  naproxen (NAPROSYN) 500 MG tablet Take 1 tablet (500 mg total) by mouth 2 (two) times daily. 01/09/19   Usbaldo Pannone C, PA-C  terbinafine (LAMISIL AT) 1 % cream Apply 1 application  topically 2 (two) times daily. 12/12/16   Tysinger, Camelia Eng, PA-C    Family History Family History  Problem Relation Age of Onset  . Goiter Maternal Grandmother   . Hypertension Maternal Grandmother   . Diabetes Maternal Grandfather   . Cancer Maternal Grandfather   . Stroke Maternal Grandfather     Social History Social History   Tobacco Use  . Smoking status: Never Smoker  . Smokeless tobacco: Never Used  Substance Use Topics  . Alcohol use: No    Alcohol/week: 0.0 standard drinks  . Drug use: No     Allergies   Latex   Review of Systems Review of Systems  Constitutional: Negative for fatigue and fever.  HENT: Negative for congestion, sinus pressure and sore throat.   Eyes: Negative for photophobia, pain and visual disturbance.  Respiratory: Negative for cough and shortness of breath.   Cardiovascular: Negative for chest pain.  Gastrointestinal:  Negative for abdominal pain, nausea and vomiting.  Genitourinary: Negative for decreased urine volume and hematuria.  Musculoskeletal: Negative for myalgias, neck pain and neck stiffness.  Neurological: Positive for dizziness, syncope and headaches. Negative for facial asymmetry, speech difficulty, weakness, light-headedness and numbness.     Physical Exam Triage Vital Signs ED Triage Vitals [01/09/19 1435]  Enc Vitals Group     BP 117/68     Pulse Rate 87     Resp 18     Temp 99.3 F (37.4 C)     Temp Source Oral     SpO2 100 %     Weight      Height      Head Circumference      Peak Flow      Pain Score 1     Pain Loc      Pain Edu?      Excl. in [place]?    No data found.  Updated Vital Signs BP 117/68 (BP Location: Right Arm)   Pulse 87   Temp 99.3 F (37.4 C) (Oral)   Resp 18   SpO2 100%   Visual Acuity Right Eye Distance:   Left Eye Distance:   Bilateral Distance:    Right Eye Near:   Left Eye Near:    Bilateral Near:     Physical Exam Vitals signs and nursing note reviewed.  Constitutional:      General: She is not in acute distress.    Appearance: She is well-developed.  HENT:     Head: Normocephalic and atraumatic.     Comments: No bruising swelling or deformity noted to face, mild tenderness to palpation over lateral aspect of infraorbital area as well as superior lateral supraorbital area, no crepitus or deformity palpated    Ears:     Comments: Bilateral ears without tenderness to palpation of external auricle, tragus and mastoid, EAC's without erythema or swelling, TM's with good bony landmarks and cone of light. Non erythematous. No hemotympnum    Mouth/Throat:     Comments: Oral mucosa pink and moist, no tonsillar enlargement or exudate. Posterior pharynx patent and nonerythematous, no uvula deviation or swelling. Normal phonation. Palate elevates symmetrically Eyes:     Extraocular Movements: Extraocular movements intact.      Conjunctiva/sclera: Conjunctivae normal.     Pupils: Pupils are equal, round, and reactive to light.     Comments: Wearing glasses  Neck:     Musculoskeletal: Neck supple.  Cardiovascular:     Rate and Rhythm: Normal rate and regular rhythm.  Heart sounds: No murmur.  Pulmonary:     Effort: Pulmonary effort is normal. No respiratory distress.     Breath sounds: Normal breath sounds.     Comments: Breathing comfortably at rest, CTABL, no wheezing, rales or other adventitious sounds auscultated Abdominal:     Palpations: Abdomen is soft.     Tenderness: There is no abdominal tenderness.  Musculoskeletal:     Comments: Moving all extremities appropriately  Skin:    General: Skin is warm and dry.  Neurological:     General: No focal deficit present.     Mental Status: She is alert and oriented to person, place, and time. Mental status is at baseline.     Cranial Nerves: No cranial nerve deficit.     Motor: No weakness.     Gait: Gait normal.     Comments: Patient A&O x3, cranial nerves II-XII grossly intact, strength at shoulders, hips and knees 5/5, equal bilaterally, patellar reflex 2+ bilaterally. Gait without abnormality.      UC Treatments / Results  Labs (all labs ordered are listed, but only abnormal results are displayed) Labs Reviewed  CBC  BASIC METABOLIC PANEL    EKG   Radiology No results found.  Procedures Procedures (including critical care time)  Medications Ordered in UC Medications - No data to display  Initial Impression / Assessment and Plan / UC Course  I have reviewed the triage vital signs and the nursing notes.  Pertinent labs & imaging results that were available during my care of the patient were reviewed by me and considered in my medical decision making (see chart for details).  Clinical Course as of Jan 09 2246  Nancy Fetter Jan 09, 2019  1545 EKG normal sinus rhythm, no acute signs of ischemia or infarction   [HW]    Clinical Course User  Index [HW] Kesley Mullens C, PA-C    Negative orthostatics, EKG without signs of arrhythmia.  Checking BMP, CBC today.  Labs unremarkable.  Providing Naprosyn to use as needed for headache, has not tried oral medicines, deferring IM injections at this time.  No neuro deficits, does not seem to to warrant emergent work-up at this time, but did advise that if patient has repeat episode of syncope or worsening headache to follow-up in emergency room.  Do not suspect underlying orbital fracture.  Treating symptomatically.  Dizziness seems positional, not described as classic vertigo, but will provide meclizine to use as needed.  Continue to monitor symptoms, recommending follow-up with primary care, ENT if dizziness persisting.  If having repeat episodes of syncope may need follow-up with cardiology for Holter monitor.  Patient is stable at this time, discussed warning signs,Discussed strict return precautions. Patient verbalized understanding and is agreeable with plan.  Final Clinical Impressions(s) / UC Diagnoses   Final diagnoses:  Dizziness  Migraine without aura and without status migrainosus, not intractable     Discharge Instructions     EKG normal I will call if blood work abnormal Please continue to drink gatorade/push fluids Naprosyn twice daily as needed for headaches May try meclizine as needed for dizziness- may cause drowsiness, do not drive or work after taking  Follow up with Primary care or ENT if symptoms continuing Follow up here or ED if dizziness or headaches worsening     ED Prescriptions    Medication Sig Dispense Auth. Provider   meclizine (ANTIVERT) 12.5 MG tablet Take 1 tablet (12.5 mg total) by mouth 3 (three) times daily as needed  for dizziness. 30 tablet Geraldyn Shain C, PA-C   naproxen (NAPROSYN) 500 MG tablet Take 1 tablet (500 mg total) by mouth 2 (two) times daily. 30 tablet Rayshaun Needle, [place] C, PA-C     PDMP not reviewed this encounter.    Joneen Caraway [place] C, PA-C 01/10/19 2249

## 2019-01-09 NOTE — Discharge Instructions (Addendum)
EKG normal I will call if blood work abnormal Please continue to drink gatorade/push fluids Naprosyn twice daily as needed for headaches May try meclizine as needed for dizziness- may cause drowsiness, do not drive or work after taking  Follow up with Primary care or ENT if symptoms continuing Follow up here or ED if dizziness or headaches worsening

## 2019-01-10 ENCOUNTER — Telehealth (HOSPITAL_COMMUNITY): Payer: Self-pay | Admitting: Emergency Medicine

## 2019-01-10 NOTE — Telephone Encounter (Signed)
Labs unremarkable. Attempted to reach patient. No answer at this time. Notified in [employer]

## 2019-04-21 DIAGNOSIS — H40013 Open angle with borderline findings, low risk, bilateral: Secondary | ICD-10-CM | POA: Diagnosis not present

## 2019-09-30 DIAGNOSIS — Z01419 Encounter for gynecological examination (general) (routine) without abnormal findings: Secondary | ICD-10-CM | POA: Diagnosis not present

## 2019-12-30 DIAGNOSIS — N3001 Acute cystitis with hematuria: Secondary | ICD-10-CM | POA: Diagnosis not present

## 2020-03-22 ENCOUNTER — Other Ambulatory Visit: Payer: Self-pay

## 2020-03-22 DIAGNOSIS — Z20822 Contact with and (suspected) exposure to covid-19: Secondary | ICD-10-CM | POA: Diagnosis not present

## 2020-03-23 LAB — SARS-COV-2, NAA 2 DAY TAT

## 2020-03-23 LAB — NOVEL CORONAVIRUS, NAA: SARS-CoV-2, NAA: DETECTED — AB

## 2020-07-26 IMAGING — MG MM DIGITAL SCREENING BILAT W/ TOMO W/ CAD
8 series · 9 of 24 positions shown · non-contrast
Comparison: None.

CLINICAL DATA: Screening. Baseline

EXAM:
DIGITAL SCREENING BILATERAL MAMMOGRAM WITH TOMO AND CAD

[R CC synth-2D]
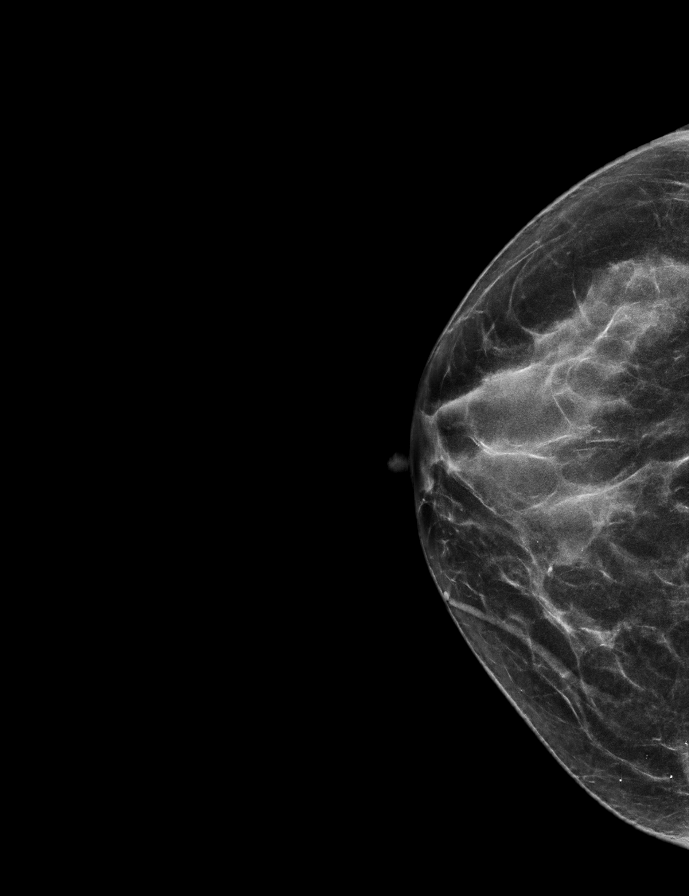

[L CC synth-2D]
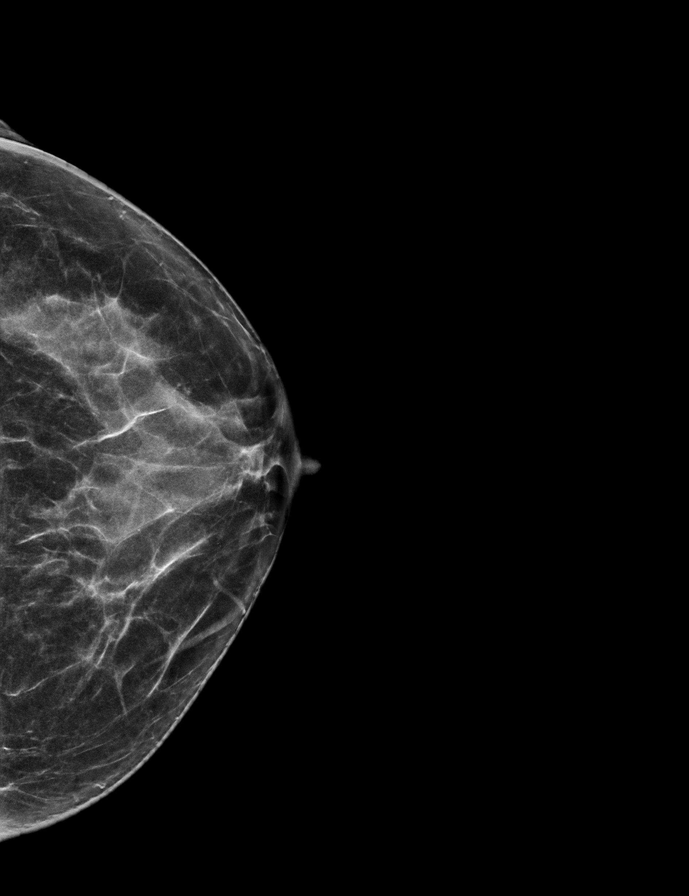

[L MLO synth-2D]
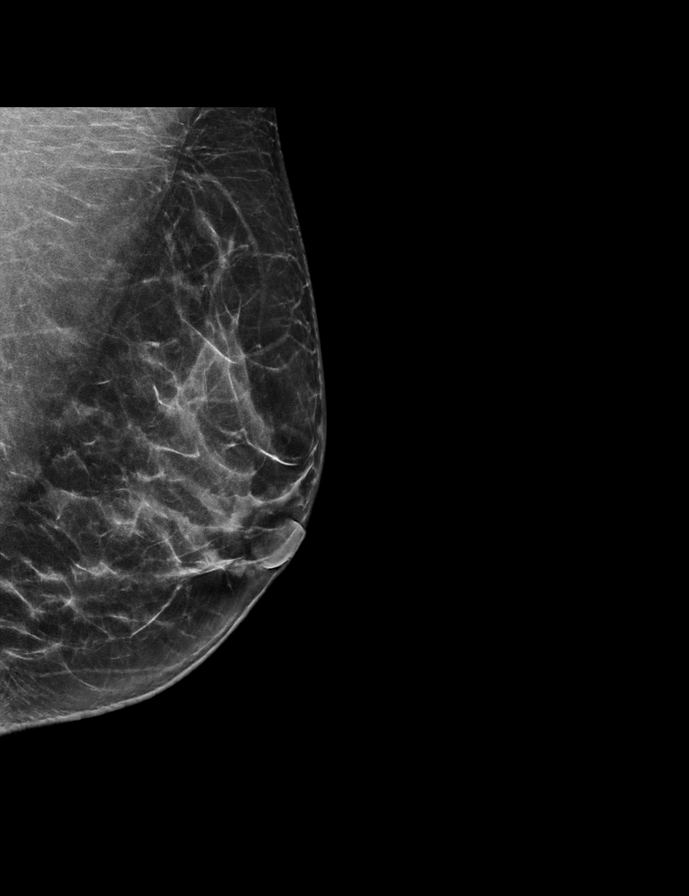

[R MLO synth-2D]
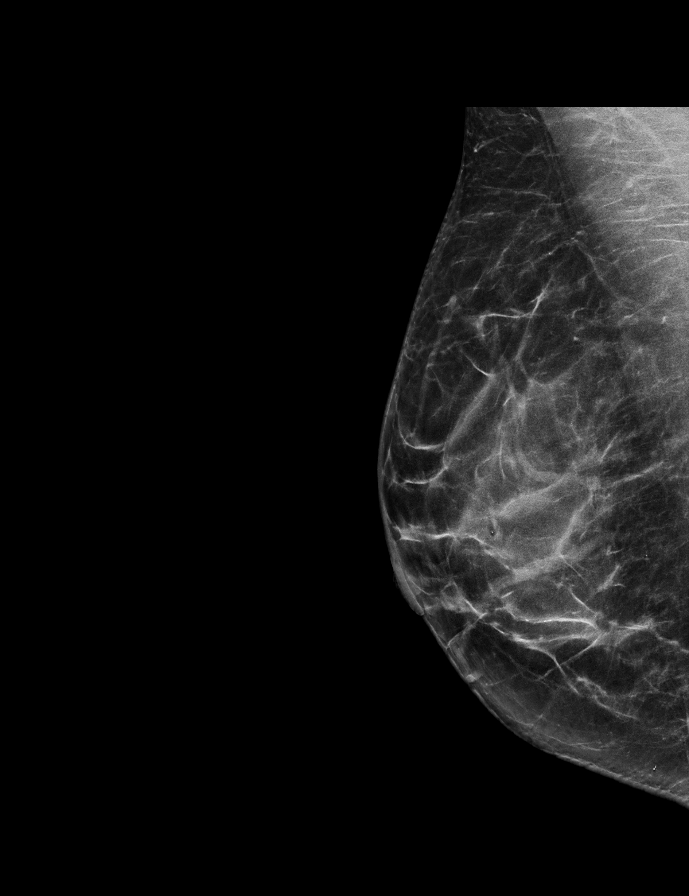

[L CC tomo · 2 of 57 frames shown]
[frame 19/57]
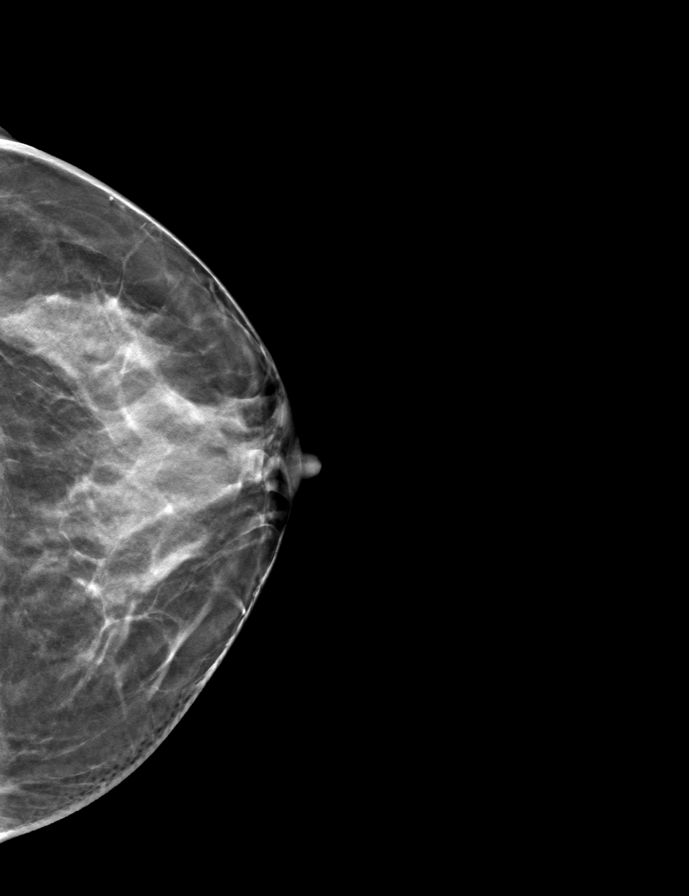
[frame 29/57]
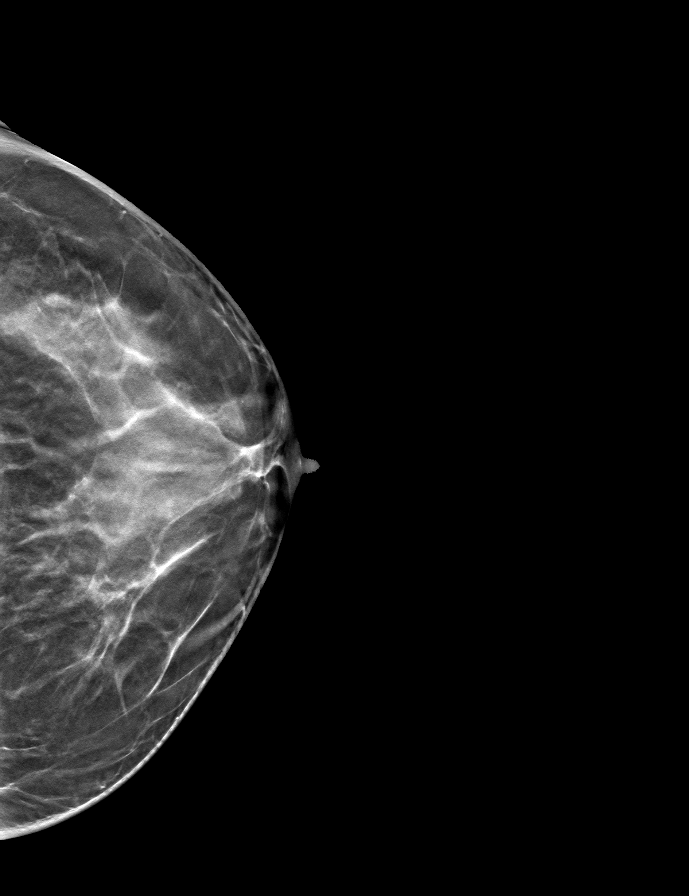

[L MLO tomo · tomo slice 31/60.0]
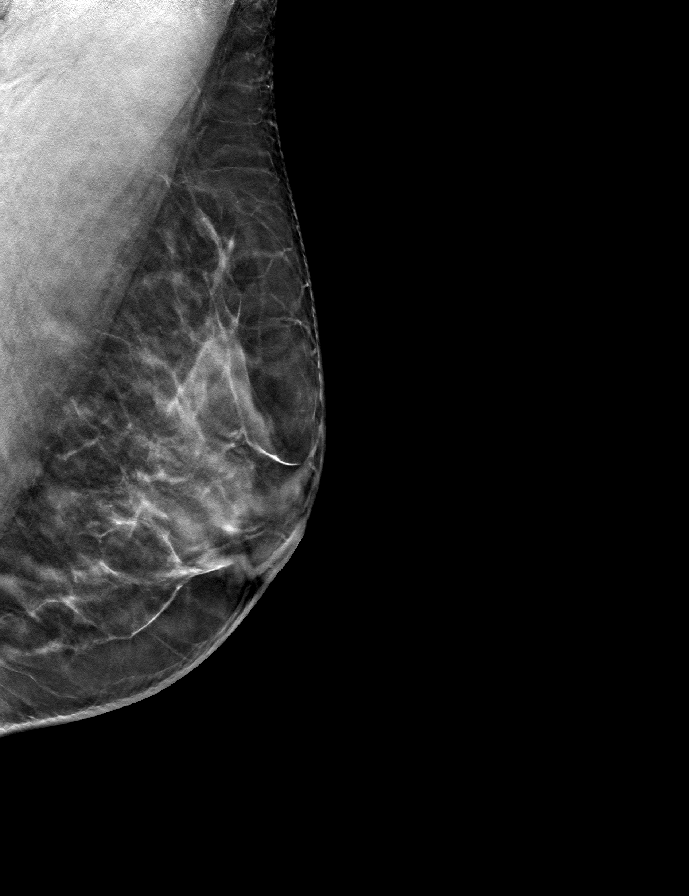

[R CC tomo · tomo slice 29/58.0]
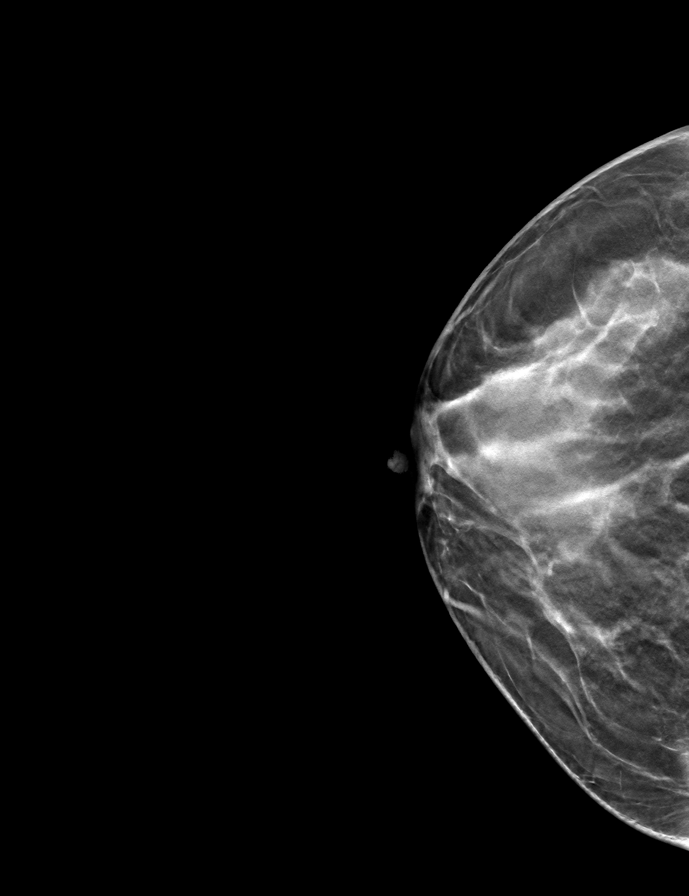

[R MLO tomo · tomo slice 33/66.0]
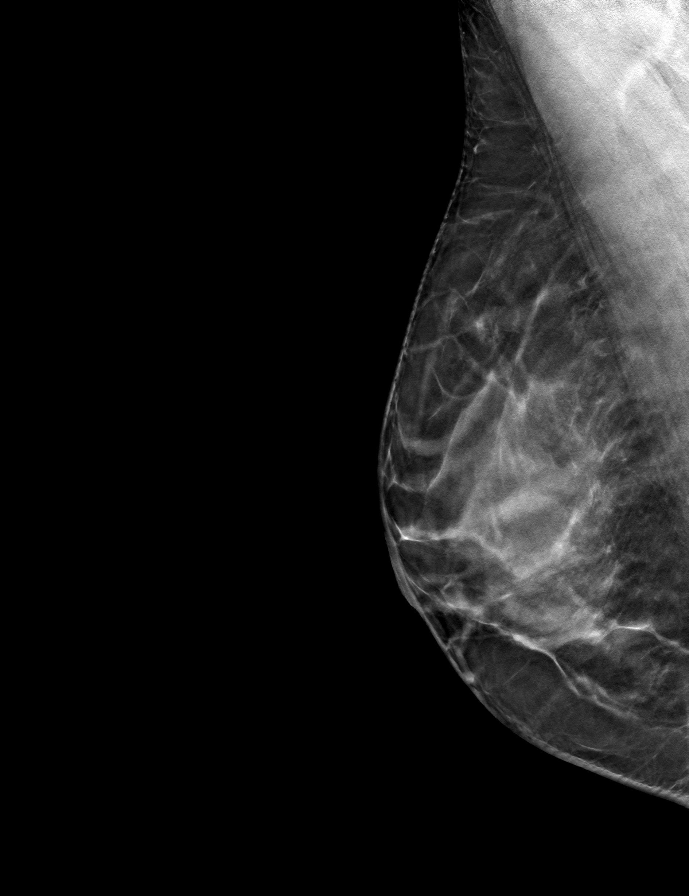

[9 of 24 positions shown; findings below may reference images not displayed]

ACR Breast Density Category c: The breast tissue is heterogeneously
dense, which may obscure small masses
FINDINGS: There are no findings suspicious for malignancy. Images were
processed with CAD.
IMPRESSION: No mammographic evidence of malignancy. A result letter of this
screening mammogram will be mailed directly to the patient.

RECOMMENDATION:
Screening mammogram in one year. (Code:KW-5-5ZM)

BI-RADS CATEGORY  1: Negative.

## 2020-09-13 ENCOUNTER — Telehealth: Payer: BC Managed Care – PPO | Admitting: Physician Assistant

## 2020-09-13 DIAGNOSIS — M7581 Other shoulder lesions, right shoulder: Secondary | ICD-10-CM | POA: Diagnosis not present

## 2020-09-13 MED ORDER — MELOXICAM 15 MG PO TABS: 15.0000 mg | ORAL_TABLET | Freq: Every day | ORAL | 0 refills | Status: AC

## 2020-09-13 MED ORDER — CYCLOBENZAPRINE HCL 10 MG PO TABS: 10.0000 mg | ORAL_TABLET | Freq: Three times a day (TID) | ORAL | 0 refills | Status: AC | PRN

## 2020-09-13 NOTE — Progress Notes (Signed)
Virtual Visit Consent   Melinda [name], you are scheduled for a virtual visit with a [facility] provider today.     Just as with appointments in the office, your consent must be obtained to participate.  Your consent will be active for this visit and any virtual visit you may have with one of our providers in the next 365 days.     If you have a MyChart account, a copy of this consent can be sent to you electronically.  All virtual visits are billed to your insurance company just like a traditional visit in the office.    As this is a virtual visit, video technology does not allow for your provider to perform a traditional examination.  This may limit your provider's ability to fully assess your condition.  If your provider identifies any concerns that need to be evaluated in person or the need to arrange testing (such as labs, EKG, etc.), we will make arrangements to do so.     Although advances in technology are sophisticated, we cannot ensure that it will always work on either your end or our end.  If the connection with a video visit is poor, the visit may have to be switched to a telephone visit.  With either a video or telephone visit, we are not always able to ensure that we have a secure connection.     I need to obtain your verbal consent now.   Are you willing to proceed with your visit today?    Melinda [name] has provided verbal consent on 09/13/2020 for a virtual visit (video or telephone).   Leeanne Rio, [place]   Date: 09/13/2020 11:17 AM   Virtual Visit via Video Note   I, Leeanne Rio, connected with  Melinda [name]  ([id number], 23-Jul-1975) on 09/13/20 at 11:00 AM EDT by a video-enabled telemedicine application and verified that I am speaking with the correct person using two identifiers.  Location: Patient: Virtual Visit Location Patient: Home Provider: Virtual Visit Location Provider: Home Office   I discussed the limitations of evaluation and management by  telemedicine and the availability of in person appointments. The patient expressed understanding and agreed to proceed.    History of Present Illness: Melinda [name] is a 45 y.o. who identifies as a female who was assigned female at birth, and is being seen today for R shoulder pain x 2 weeks. Initially thought she slept wong on the shoulder, noting some R sided posterior neck tension and upper shoulder pain. Neck tension in resolving within 2 days. Pain in shoulder has continued and worse with certain movements. Also now noting some occasional tingling in the arm down to forearm. Denies any noted weakness. Denies symptoms of LUE. Has history of pinched nerve in her neck but has not had issue in quite some time. Wondering if she irritated this. Has been taking Ibuprofen OTC with some improvement. Also using heating pad and ice to the area.     HPI: HPI  Problems:  Patient Active Problem List   Diagnosis Date Noted   Pain in right foot 06/30/2017   Normal labor 06/25/2016   Status post repeat low transverse cesarean section 06/25/2016    Allergies:  Allergies  Allergen Reactions   Latex Rash   Medications:  Current Outpatient Medications:    cyclobenzaprine (FLEXERIL) 10 MG tablet, Take 1 tablet (10 mg total) by mouth 3 (three) times daily as needed for muscle spasms., Disp: 15 tablet,  Rfl: 0   ibuprofen (ADVIL) 400 MG tablet, Take 400 mg by mouth every 6 (six) hours as needed., Disp: , Rfl:    meloxicam (MOBIC) 15 MG tablet, Take 1 tablet (15 mg total) by mouth daily., Disp: 15 tablet, Rfl: 0  Observations/Objective: Patient is well-developed, well-nourished in no acute distress.  Resting comfortably at home.  Head is normocephalic, atraumatic.  No labored breathing. Speech is clear and coherent with logical content.  Patient is alert and oriented at baseline.  Normal ROM of shoulder put pain with abduction > 60 degrees and pain with both internal and external rotation.   Assessment  and Plan: 1. Right rotator cuff tendinitis - meloxicam (MOBIC) 15 MG tablet; Take 1 tablet (15 mg total) by mouth daily.  Dispense: 15 tablet; Refill: 0 - cyclobenzaprine (FLEXERIL) 10 MG tablet; Take 1 tablet (10 mg total) by mouth 3 (three) times daily as needed for muscle spasms.  Dispense: 15 tablet; Refill: 0 With preserved ROM albeit with pain. Atraumatic. Supportive measures discussed. Start Meloxicam 15 mg once daily with food. Tylenol for breakthrough pain. Ok to continue heat in 15-minute increments. Will add-on cyclobenzaprine in the evenings. Can use up to TID but no driving if taking medication in the days. Will need in-office evaluation for any non-resolving or worsening symptoms.  Follow Up Instructions: I discussed the assessment and treatment plan with the patient. The patient was provided an opportunity to ask questions and all were answered. The patient agreed with the plan and demonstrated an understanding of the instructions.  A copy of instructions were sent to the patient via MyChart.  The patient was advised to call back or seek an in-person evaluation if the symptoms worsen or if the condition fails to improve as anticipated.  Time:  I spent 15 minutes with the patient via telehealth technology discussing the above problems/concerns.    Leeanne Rio, PA-C

## 2020-09-13 NOTE — Patient Instructions (Signed)
  Melinda [name], thank you for joining Leeanne Rio, PA-C for today's virtual visit.  While this provider is not your primary care provider (PCP), if your PCP is located in our provider database this encounter information will be shared with them immediately following your visit.  Consent: (Patient) Melinda [name] provided verbal consent for this virtual visit at the beginning of the encounter.  Current Medications:  Current Outpatient Medications:    Ibuprofen-Famotidine 800-26.6 MG TABS, TAKE 1 TAB PO TID PRN, Disp: 30 tablet, Rfl: 6   meclizine (ANTIVERT) 12.5 MG tablet, Take 1 tablet (12.5 mg total) by mouth 3 (three) times daily as needed for dizziness., Disp: 30 tablet, Rfl: 0   naproxen (NAPROSYN) 500 MG tablet, Take 1 tablet (500 mg total) by mouth 2 (two) times daily., Disp: 30 tablet, Rfl: 0   terbinafine (LAMISIL AT) 1 % cream, Apply 1 application topically 2 (two) times daily., Disp: 42 g, Rfl: 0   Medications ordered in this encounter:  No orders of the defined types were placed in this encounter.    *If you need refills on other medications prior to your next appointment, please contact your pharmacy*  Follow-Up: Call back or seek an in-person evaluation if the symptoms worsen or if the condition fails to improve as anticipated.  Other Instructions Avoid heavy lifting or overexertion. Ok to continue heat to the area for 10-15 minutes, a few times per day. Take the Meloxicam once daily with food. You can use tylenol OTC for breakthrough pain. Take the Cyclobenzaprine (Flexeril) in the evening. You can take up to three times daily as discussed, but no driving or operating heavy machinery if taking during the day.  If symptoms are not improving/resolving within next week or if you note any new or worsening symptoms, please seek in-person evaluation.   If you have been instructed to have an in-person evaluation today at a local Urgent Care facility, please use the  link below. It will take you to a list of all of our available [facility], including address, phone number and hours of operation. Please do not delay care.  [facility]  If you or a family member do not have a primary care provider, use the link below to schedule a visit and establish care. When you choose a [facility] primary care physician or advanced practice provider, you gain a long-term partner in health. Find a Primary Care Provider  Learn more about 's in-office and virtual care options: [facility]

## 2020-09-20 DIAGNOSIS — M25511 Pain in right shoulder: Secondary | ICD-10-CM | POA: Diagnosis not present

## 2020-10-03 DIAGNOSIS — M542 Cervicalgia: Secondary | ICD-10-CM | POA: Diagnosis not present

## 2020-10-03 DIAGNOSIS — M25511 Pain in right shoulder: Secondary | ICD-10-CM | POA: Diagnosis not present

## 2020-10-03 DIAGNOSIS — R202 Paresthesia of skin: Secondary | ICD-10-CM | POA: Diagnosis not present

## 2020-10-11 DIAGNOSIS — M67911 Unspecified disorder of synovium and tendon, right shoulder: Secondary | ICD-10-CM | POA: Diagnosis not present

## 2020-10-23 DIAGNOSIS — M25511 Pain in right shoulder: Secondary | ICD-10-CM | POA: Diagnosis not present

## 2020-10-23 DIAGNOSIS — M7541 Impingement syndrome of right shoulder: Secondary | ICD-10-CM | POA: Diagnosis not present

## 2020-10-29 DIAGNOSIS — M542 Cervicalgia: Secondary | ICD-10-CM | POA: Diagnosis not present

## 2020-11-05 DIAGNOSIS — M25511 Pain in right shoulder: Secondary | ICD-10-CM | POA: Diagnosis not present

## 2020-11-05 DIAGNOSIS — M7541 Impingement syndrome of right shoulder: Secondary | ICD-10-CM | POA: Diagnosis not present

## 2020-11-06 DIAGNOSIS — M79641 Pain in right hand: Secondary | ICD-10-CM | POA: Diagnosis not present

## 2020-11-06 DIAGNOSIS — M79642 Pain in left hand: Secondary | ICD-10-CM | POA: Diagnosis not present

## 2020-11-06 DIAGNOSIS — M67911 Unspecified disorder of synovium and tendon, right shoulder: Secondary | ICD-10-CM | POA: Diagnosis not present

## 2020-11-09 DIAGNOSIS — M542 Cervicalgia: Secondary | ICD-10-CM | POA: Diagnosis not present

## 2020-12-03 DIAGNOSIS — Z01419 Encounter for gynecological examination (general) (routine) without abnormal findings: Secondary | ICD-10-CM | POA: Diagnosis not present

## 2020-12-04 DIAGNOSIS — M7541 Impingement syndrome of right shoulder: Secondary | ICD-10-CM | POA: Diagnosis not present

## 2020-12-04 DIAGNOSIS — M25511 Pain in right shoulder: Secondary | ICD-10-CM | POA: Diagnosis not present

## 2020-12-15 ENCOUNTER — Ambulatory Visit (HOSPITAL_COMMUNITY)
Admission: EM | Admit: 2020-12-15 | Discharge: 2020-12-15 | Disposition: A | Discharge status: Home / Self Care | Payer: BC Managed Care – PPO | Attending: Emergency Medicine | Admitting: Emergency Medicine

## 2020-12-15 ENCOUNTER — Encounter (HOSPITAL_COMMUNITY): Payer: Self-pay | Admitting: *Deleted

## 2020-12-15 ENCOUNTER — Other Ambulatory Visit: Payer: Self-pay

## 2020-12-15 DIAGNOSIS — M25532 Pain in left wrist: Secondary | ICD-10-CM

## 2020-12-15 DIAGNOSIS — M25531 Pain in right wrist: Secondary | ICD-10-CM | POA: Diagnosis not present

## 2020-12-15 DIAGNOSIS — J029 Acute pharyngitis, unspecified: Secondary | ICD-10-CM

## 2020-12-15 DIAGNOSIS — M25549 Pain in joints of unspecified hand: Secondary | ICD-10-CM | POA: Diagnosis not present

## 2020-12-15 DIAGNOSIS — M25541 Pain in joints of right hand: Secondary | ICD-10-CM | POA: Diagnosis not present

## 2020-12-15 DIAGNOSIS — M25542 Pain in joints of left hand: Secondary | ICD-10-CM

## 2020-12-15 HISTORY — DX: Other cervical disc displacement, unspecified cervical region: M50.20

## 2020-12-15 HISTORY — DX: Simple febrile convulsions: R56.00

## 2020-12-15 LAB — POCT RAPID STREP A, ED / UC: Streptococcus, Group A Screen (Direct): NEGATIVE

## 2020-12-15 MED ORDER — METHYLPREDNISOLONE 4 MG PO TBPK: ORAL_TABLET | ORAL | 0 refills | Status: AC

## 2020-12-15 NOTE — ED Provider Notes (Signed)
[facility]    CSN: [id number] Arrival date & time: 12/15/20  1019      History   Chief Complaint Chief Complaint  Patient presents with   Sore Throat   Wrist Pain    HPI Melinda [name] is a 45 y.o. female.   Patient states she woke up this morning around 9:30 AM feeling that the right side of her throat was very sore.  Patient denied fever, aches, chills, nausea, vomiting, diarrhea, loss of taste or smell, body ache, headache, difficulty swallowing.  Patient states she also noticed this morning that she was having difficulty making a tight fist with her hands, feels that her fingers are very swollen.  Patient reports a history of multiple joint pain, initially was treated for bursitis in her right shoulder, also complains of having pain in both wrists as well as lateral epicondylitis on her left, patient states she is right-handed.  Patient states she has never been tested for autoimmune disorder.  Patient states she has an upcoming appointment with her primary care provider.  The history is provided by the patient.   Past Medical History:  Diagnosis Date   Cervical herniated disc    Febrile seizures ([place])    Fibroid    H/O varicella    Herpes     Patient Active Problem List   Diagnosis Date Noted   Pain in right foot 06/30/2017   Normal labor 06/25/2016   Status post repeat low transverse cesarean section 06/25/2016    Past Surgical History:  Procedure Laterality Date   CESAREAN SECTION  09/10/2011   Procedure: CESAREAN SECTION;  Surgeon: Jolayne Haines, MD;  Location: [facility];  Service: Gynecology;  Laterality: N/A;   CESAREAN SECTION N/A 06/25/2016   Procedure: CESAREAN SECTION;  Surgeon: Thurnell Lose, MD;  Location: [facility];  Service: Obstetrics;  Laterality: N/A;    OB History     Gravida  2   Para  2   Term  2   Preterm  0   AB  0   Living  2      SAB  0   IAB  0   Ectopic  0   Multiple  0   Live Births  2             Home Medications    Prior to Admission medications   Medication Sig Start Date End Date Taking? Authorizing Provider  Acetaminophen (TYLENOL ARTHRITIS PAIN PO) Take by mouth.   Yes [provider]  meloxicam (MOBIC) 15 MG tablet Take 1 tablet (15 mg total) by mouth daily. 09/13/20  Yes Brunetta Jeans, PA-C  methylPREDNISolone (MEDROL DOSEPAK) 4 MG TBPK tablet Take 24 mg on day 1, 20 mg on day 2, 16 mg on day 3, 12 mg on day 4, 8 mg on day 5, 4 mg on day 6. 12/15/20  Yes Lynden Oxford Scales, PA-C  cyclobenzaprine (FLEXERIL) 10 MG tablet Take 1 tablet (10 mg total) by mouth 3 (three) times daily as needed for muscle spasms. 09/13/20   Brunetta Jeans, PA-C  ibuprofen (ADVIL) 400 MG tablet Take 400 mg by mouth every 6 (six) hours as needed.    [provider]    Family History Family History  Problem Relation Age of Onset   Healthy Mother    Healthy Father    Goiter Maternal Grandmother    Hypertension Maternal Grandmother    Diabetes Maternal Grandfather    Cancer  Maternal Grandfather    Stroke Maternal Grandfather     Social History Social History   Tobacco Use   Smoking status: Never   Smokeless tobacco: Never  Vaping Use   Vaping Use: Never used  Substance Use Topics   Alcohol use: No    Alcohol/week: 0.0 standard drinks   Drug use: No     Allergies   Latex   Review of Systems Review of Systems Pertinent findings noted in history of present illness.    Physical Exam Triage Vital Signs ED Triage Vitals  Enc Vitals Group     BP      Pulse      Resp      Temp      Temp src      SpO2      Weight      Height      Head Circumference      Peak Flow      Pain Score      Pain Loc      Pain Edu?      Excl. in [place]?    No data found.  Updated Vital Signs BP 115/69   Pulse 82   Temp 99.3 F (37.4 C) (Oral)   Resp 14   LMP 11/27/2020 (Exact Date)   SpO2 95%   Breastfeeding No   Visual Acuity Right Eye Distance:   Left  Eye Distance:   Bilateral Distance:    Right Eye Near:   Left Eye Near:    Bilateral Near:     Physical Exam Vitals and nursing note reviewed.  Constitutional:      Appearance: Normal appearance.  HENT:     Head: Normocephalic and atraumatic.     Right Ear: Tympanic membrane, ear canal and external ear normal.     Left Ear: Tympanic membrane, ear canal and external ear normal.     Nose: Nose normal.     Mouth/Throat:     Mouth: Mucous membranes are moist.     Pharynx: Oropharynx is clear. Uvula midline. No pharyngeal swelling, oropharyngeal exudate, posterior oropharyngeal erythema or uvula swelling.     Tonsils: No tonsillar exudate or tonsillar abscesses. 1+ on the right. 2+ on the left.  Eyes:     Extraocular Movements: Extraocular movements intact.     Conjunctiva/sclera: Conjunctivae normal.     Pupils: Pupils are equal, round, and reactive to light.  Cardiovascular:     Rate and Rhythm: Normal rate and regular rhythm.     Pulses: Normal pulses.     Heart sounds: Normal heart sounds.  Pulmonary:     Effort: Pulmonary effort is normal.     Breath sounds: Normal breath sounds.  Musculoskeletal:     Right hand: Swelling present.     Left hand: Swelling present.     Cervical back: Normal range of motion and neck supple.  Neurological:     General: No focal deficit present.     Mental Status: She is alert and oriented to person, place, and time. Mental status is at baseline.  Psychiatric:        Mood and Affect: Mood normal.        Behavior: Behavior normal.     UC Treatments / Results  Labs (all labs ordered are listed, but only abnormal results are displayed) Labs Reviewed  CULTURE, GROUP A STREP [facility])  POCT RAPID STREP A, ED / UC    EKG   Radiology No results found.  Procedures Procedures (including critical care time)  Medications Ordered in UC Medications - No data to display  Initial Impression / Assessment and Plan / UC Course  I have reviewed  the triage vital signs and the nursing notes.  Pertinent labs & imaging results that were available during my care of the patient were reviewed by me and considered in my medical decision making (see chart for details).     Patient was encouraged to discuss autoimmune testing with her provider in regards to her wrist and multiple joint pain.  Rapid strep test today was negative, patient was advised that she be notified of the results of the throat culture once they are received and that if antibiotics are required this will be provided as well.  I provided patient with a prescription for Medrol Dosepak to help relieve some of the stiffness, swelling and pain in her fingers.  Patient verbalized understanding and agreement of plan as discussed.  All questions were addressed during visit.  Please see discharge instructions below for further details of plan.  Final Clinical Impressions(s) / UC Diagnoses   Final diagnoses:  Pain in both wrists  Sore throat  Pain in multiple finger joints     Discharge Instructions      Please begin methylprednisolone for your pain in your finger joints, wrists and left elbow.  Please take 1 row of tablets all at once each day until complete.  You will be notified of the results of your throat culture once they are received, if antibiotics are needed this will be provided for you.  Please talk with your primary care provider about having autoimmune testing done.  Some of the symptoms you talked about today are concerning for possible rheumatoid arthritis, systemic lupus, ankylosing spondylitis.  Thank you for visiting urgent care today, IV for better soon.     ED Prescriptions     Medication Sig Dispense Auth. Provider   methylPREDNISolone (MEDROL DOSEPAK) 4 MG TBPK tablet Take 24 mg on day 1, 20 mg on day 2, 16 mg on day 3, 12 mg on day 4, 8 mg on day 5, 4 mg on day 6. 21 tablet Lynden Oxford Scales, PA-C      PDMP not reviewed this encounter.    Lynden Oxford Scales, [place] 12/15/20 [phone]

## 2020-12-15 NOTE — ED Triage Notes (Addendum)
States woke up with right-sided sore throat @ 0130 this AM.  Denies any cough, congestion, fevers, or any other sxs.  Describes sensation of swelling in right throat and tonsil.  Also c/o inability to make a tight fist with bilat hands due to wrist and hand pain.  Denies any parasthesias.  Denies injury.

## 2020-12-15 NOTE — Discharge Instructions (Signed)
Please begin methylprednisolone for your pain in your finger joints, wrists and left elbow.  Please take 1 row of tablets all at once each day until complete.  You will be notified of the results of your throat culture once they are received, if antibiotics are needed this will be provided for you.  Please talk with your primary care provider about having autoimmune testing done.  Some of the symptoms you talked about today are concerning for possible rheumatoid arthritis, systemic lupus, ankylosing spondylitis.  Thank you for visiting urgent care today, IV for better soon.

## 2020-12-17 LAB — CULTURE, GROUP A STREP (THRC)

## 2020-12-18 DIAGNOSIS — M7989 Other specified soft tissue disorders: Secondary | ICD-10-CM | POA: Diagnosis not present

## 2020-12-18 DIAGNOSIS — R7303 Prediabetes: Secondary | ICD-10-CM | POA: Diagnosis not present

## 2020-12-18 DIAGNOSIS — Z0000 Encounter for general adult medical examination without abnormal findings: Secondary | ICD-10-CM | POA: Diagnosis not present

## 2020-12-18 DIAGNOSIS — R0789 Other chest pain: Secondary | ICD-10-CM | POA: Diagnosis not present

## 2020-12-18 DIAGNOSIS — Z1389 Encounter for screening for other disorder: Secondary | ICD-10-CM | POA: Diagnosis not present

## 2020-12-18 DIAGNOSIS — M25422 Effusion, left elbow: Secondary | ICD-10-CM | POA: Diagnosis not present

## 2021-01-10 DIAGNOSIS — R252 Cramp and spasm: Secondary | ICD-10-CM | POA: Diagnosis not present

## 2021-01-10 DIAGNOSIS — L720 Epidermal cyst: Secondary | ICD-10-CM | POA: Diagnosis not present

## 2021-01-10 DIAGNOSIS — M25531 Pain in right wrist: Secondary | ICD-10-CM | POA: Diagnosis not present

## 2021-01-10 DIAGNOSIS — M25532 Pain in left wrist: Secondary | ICD-10-CM | POA: Diagnosis not present

## 2021-01-16 DIAGNOSIS — R700 Elevated erythrocyte sedimentation rate: Secondary | ICD-10-CM | POA: Diagnosis not present

## 2021-01-28 DIAGNOSIS — M25531 Pain in right wrist: Secondary | ICD-10-CM | POA: Diagnosis not present

## 2021-01-28 DIAGNOSIS — M5441 Lumbago with sciatica, right side: Secondary | ICD-10-CM | POA: Diagnosis not present

## 2021-01-28 DIAGNOSIS — M25532 Pain in left wrist: Secondary | ICD-10-CM | POA: Diagnosis not present

## 2021-06-07 DIAGNOSIS — M5442 Lumbago with sciatica, left side: Secondary | ICD-10-CM | POA: Diagnosis not present

## 2021-06-07 DIAGNOSIS — M5441 Lumbago with sciatica, right side: Secondary | ICD-10-CM | POA: Diagnosis not present

## 2021-06-07 DIAGNOSIS — G8929 Other chronic pain: Secondary | ICD-10-CM | POA: Diagnosis not present

## 2021-07-30 DIAGNOSIS — R293 Abnormal posture: Secondary | ICD-10-CM | POA: Diagnosis not present

## 2021-07-30 DIAGNOSIS — M5459 Other low back pain: Secondary | ICD-10-CM | POA: Diagnosis not present

## 2021-08-30 DIAGNOSIS — M5459 Other low back pain: Secondary | ICD-10-CM | POA: Diagnosis not present

## 2021-08-30 DIAGNOSIS — R293 Abnormal posture: Secondary | ICD-10-CM | POA: Diagnosis not present

## 2021-09-06 DIAGNOSIS — M5459 Other low back pain: Secondary | ICD-10-CM | POA: Diagnosis not present

## 2021-09-06 DIAGNOSIS — R293 Abnormal posture: Secondary | ICD-10-CM | POA: Diagnosis not present

## 2021-09-20 DIAGNOSIS — R293 Abnormal posture: Secondary | ICD-10-CM | POA: Diagnosis not present

## 2021-09-20 DIAGNOSIS — M5459 Other low back pain: Secondary | ICD-10-CM | POA: Diagnosis not present

## 2021-12-20 DIAGNOSIS — Z0000 Encounter for general adult medical examination without abnormal findings: Secondary | ICD-10-CM | POA: Diagnosis not present

## 2021-12-20 DIAGNOSIS — R7303 Prediabetes: Secondary | ICD-10-CM | POA: Diagnosis not present

## 2021-12-20 DIAGNOSIS — E7800 Pure hypercholesterolemia, unspecified: Secondary | ICD-10-CM | POA: Diagnosis not present

## 2021-12-25 DIAGNOSIS — Z01419 Encounter for gynecological examination (general) (routine) without abnormal findings: Secondary | ICD-10-CM | POA: Diagnosis not present

## 2022-05-09 ENCOUNTER — Encounter (HOSPITAL_COMMUNITY): Payer: Self-pay | Admitting: Emergency Medicine

## 2022-05-09 ENCOUNTER — Ambulatory Visit (HOSPITAL_COMMUNITY)
Admission: EM | Admit: 2022-05-09 | Discharge: 2022-05-09 | Disposition: A | Discharge status: Home / Self Care | Payer: No Typology Code available for payment source | Attending: Emergency Medicine | Admitting: Emergency Medicine

## 2022-05-09 DIAGNOSIS — J101 Influenza due to other identified influenza virus with other respiratory manifestations: Secondary | ICD-10-CM

## 2022-05-09 LAB — POC INFLUENZA A AND B ANTIGEN (URGENT CARE ONLY)
INFLUENZA A ANTIGEN, POC: NEGATIVE
INFLUENZA B ANTIGEN, POC: POSITIVE — AB

## 2022-05-09 NOTE — Discharge Instructions (Addendum)
You tested positive for influenza B in clinic.  You can alternate between Tylenol and ibuprofen every 4-6 hours as needed for pain and discomfort.  Please ensure you are drinking at least 64 ounces of water daily.  If you develop nasal congestion you can do Mucinex and Flonase.  Please return to clinic if symptoms persist beyond the next week, symptoms worsen, or any changes.

## 2022-05-09 NOTE — ED Triage Notes (Signed)
Since Wed had fatigue, headache, body ahces. Had Aleve. Home covid was negative.

## 2022-05-09 NOTE — ED Provider Notes (Signed)
[facility]    CSN: [id number] Arrival date & time: 05/09/22  [id number]      History   Chief Complaint Chief Complaint  Patient presents with   Fatigue   Headache    HPI Melinda [name] is a 47 y.o. female.   Patient reports since Wednesday she has had fatigue, headache and bodyaches.  Denies abdominal pain, nausea, vomiting.  She has taken Aleve and had a negative home COVID test.  Her husband and son recently had similar symptoms that started Sunday and are now resolved.  The history is provided by the patient.  Headache Associated symptoms: fatigue   Associated symptoms: no abdominal pain, no back pain, no cough, no ear pain, no eye pain, no fever, no seizures, no sore throat and no vomiting     Past Medical History:  Diagnosis Date   Cervical herniated disc    Febrile seizures ([place])    Fibroid    H/O varicella    Herpes     Patient Active Problem List   Diagnosis Date Noted   Pain in right foot 06/30/2017   Normal labor 06/25/2016   Status post repeat low transverse cesarean section 06/25/2016    Past Surgical History:  Procedure Laterality Date   CESAREAN SECTION  09/10/2011   Procedure: CESAREAN SECTION;  Surgeon: Jolayne Haines, MD;  Location: [facility];  Service: Gynecology;  Laterality: N/A;   CESAREAN SECTION N/A 06/25/2016   Procedure: CESAREAN SECTION;  Surgeon: Thurnell Lose, MD;  Location: [facility];  Service: Obstetrics;  Laterality: N/A;    OB History     Gravida  2   Para  2   Term  2   Preterm  0   AB  0   Living  2      SAB  0   IAB  0   Ectopic  0   Multiple  0   Live Births  2            Home Medications    Prior to Admission medications   Medication Sig Start Date End Date Taking? Authorizing Provider  Acetaminophen (TYLENOL ARTHRITIS PAIN PO) Take by mouth.    [provider]  cyclobenzaprine (FLEXERIL) 10 MG tablet Take 1 tablet (10 mg total) by mouth 3 (three) times daily as needed for  muscle spasms. 09/13/20   Brunetta Jeans, PA-C  ibuprofen (ADVIL) 400 MG tablet Take 400 mg by mouth every 6 (six) hours as needed.    [provider]  meloxicam (MOBIC) 15 MG tablet Take 1 tablet (15 mg total) by mouth daily. 09/13/20   Brunetta Jeans, PA-C  methylPREDNISolone (MEDROL DOSEPAK) 4 MG TBPK tablet Take 24 mg on day 1, 20 mg on day 2, 16 mg on day 3, 12 mg on day 4, 8 mg on day 5, 4 mg on day 6. 12/15/20   Lynden Oxford Scales, PA-C    Family History Family History  Problem Relation Age of Onset   Healthy Mother    Healthy Father    Goiter Maternal Grandmother    Hypertension Maternal Grandmother    Diabetes Maternal Grandfather    Cancer Maternal Grandfather    Stroke Maternal Grandfather     Social History Social History   Tobacco Use   Smoking status: Never   Smokeless tobacco: Never  Vaping Use   Vaping Use: Never used  Substance Use Topics   Alcohol use: No    Alcohol/week:  0.0 standard drinks of alcohol   Drug use: No     Allergies   Latex   Review of Systems Review of Systems  Constitutional:  Positive for fatigue. Negative for chills and fever.  HENT:  Negative for ear pain and sore throat.   Eyes:  Negative for pain and visual disturbance.  Respiratory:  Negative for cough and shortness of breath.   Cardiovascular:  Negative for chest pain and palpitations.  Gastrointestinal:  Negative for abdominal pain and vomiting.  Genitourinary:  Negative for dysuria and hematuria.  Musculoskeletal:  Positive for arthralgias. Negative for back pain.  Skin:  Negative for color change and rash.  Neurological:  Positive for headaches. Negative for seizures and syncope.  All other systems reviewed and are negative.    Physical Exam Triage Vital Signs ED Triage Vitals [05/09/22 1031]  Enc Vitals Group     BP 111/73     Pulse Rate 98     Resp 17     Temp 98.1 F (36.7 C)     Temp Source Oral     SpO2 98 %     Weight      Height       Head Circumference      Peak Flow      Pain Score 3     Pain Loc      Pain Edu?      Excl. in [place]?    No data found.  Updated Vital Signs BP 111/73 (BP Location: Left Arm)   Pulse 98   Temp 98.1 F (36.7 C) (Oral)   Resp 17   LMP 05/06/2022   SpO2 98%   Visual Acuity Right Eye Distance:   Left Eye Distance:   Bilateral Distance:    Right Eye Near:   Left Eye Near:    Bilateral Near:     Physical Exam Vitals and nursing note reviewed.  Constitutional:      General: She is not in acute distress.    Appearance: She is well-developed.  HENT:     Head: Normocephalic and atraumatic.     Right Ear: External ear normal.     Left Ear: External ear normal.     Nose: Congestion and rhinorrhea present.     Mouth/Throat:     Mouth: Mucous membranes are moist.     Pharynx: Posterior oropharyngeal erythema present.  Eyes:     General: Lids are normal.     Conjunctiva/sclera: Conjunctivae normal.  Cardiovascular:     Rate and Rhythm: Normal rate and regular rhythm.     Heart sounds: Normal heart sounds, S1 normal and S2 normal. No murmur heard. Pulmonary:     Effort: Pulmonary effort is normal. No respiratory distress.     Breath sounds: Normal breath sounds.     Comments: Lungs vesicular posteriorly. Musculoskeletal:        General: No swelling. Normal range of motion.     Cervical back: Normal range of motion and neck supple.  Lymphadenopathy:     Cervical: Cervical adenopathy present.  Skin:    General: Skin is warm and dry.     Capillary Refill: Capillary refill takes less than 2 seconds.  Neurological:     Mental Status: She is alert.     GCS: GCS eye subscore is 4. GCS verbal subscore is 5. GCS motor subscore is 6.  Psychiatric:        Mood and Affect: Mood normal.  Behavior: Behavior is cooperative.      UC Treatments / Results  Labs (all labs ordered are listed, but only abnormal results are displayed) Labs Reviewed  POC INFLUENZA A AND B ANTIGEN  (URGENT CARE ONLY) - Abnormal; Notable for the following components:      Result Value   INFLUENZA B ANTIGEN, POC POSITIVE (*)    All other components within normal limits    EKG   Radiology No results found.  Procedures Procedures (including critical care time)  Medications Ordered in UC Medications - No data to display  Initial Impression / Assessment and Plan / UC Course  I have reviewed the triage vital signs and the nursing notes.  Pertinent labs & imaging results that were available during my care of the patient were reviewed by me and considered in my medical decision making (see chart for details).  Vitals and triage reviewed, patient is hemodynamically stable.  Lungs vesicular, afebrile.  Minor cervical lymphadenopathy and posterior pharynx erythema with postnasal drip.  Suspect viral etiology.  Requested flu testing, positive in clinic for Flu B, outside of antiviral window.   Discussed symptomatic management with Tylenol and ibuprofen, nasal decongestants and other symptomatic management.  Patient verbalized understanding, no questions at this time.    Final Clinical Impressions(s) / UC Diagnoses   Final diagnoses:  Influenza due to influenza virus, type B     Discharge Instructions      You tested positive for influenza B in clinic.  You can alternate between Tylenol and ibuprofen every 4-6 hours as needed for pain and discomfort.  Please ensure you are drinking at least 64 ounces of water daily.  If you develop nasal congestion you can do Mucinex and Flonase.  Please return to clinic if symptoms persist beyond the next week, symptoms worsen, or any changes.     ED Prescriptions   None    I have reviewed the PDMP during this encounter.   Niya Behler, [place], [place] 05/09/22 1137
